# Patient Record
Sex: Female | Born: 1950 | Race: White | Hispanic: No | Marital: Married | Smoking: Never smoker
Health system: Southern US, Community
[De-identification: ages and names within clinical notes are randomized; demographics above are authoritative.]

## PROBLEM LIST (undated history)

## (undated) DIAGNOSIS — I341 Nonrheumatic mitral (valve) prolapse: Secondary | ICD-10-CM

## (undated) DIAGNOSIS — F419 Anxiety disorder, unspecified: Secondary | ICD-10-CM

## (undated) DIAGNOSIS — R61 Generalized hyperhidrosis: Secondary | ICD-10-CM

## (undated) DIAGNOSIS — M1990 Unspecified osteoarthritis, unspecified site: Secondary | ICD-10-CM

## (undated) DIAGNOSIS — I499 Cardiac arrhythmia, unspecified: Secondary | ICD-10-CM

## (undated) DIAGNOSIS — Z9889 Other specified postprocedural states: Secondary | ICD-10-CM

## (undated) DIAGNOSIS — Z923 Personal history of irradiation: Secondary | ICD-10-CM

## (undated) DIAGNOSIS — C50919 Malignant neoplasm of unspecified site of unspecified female breast: Secondary | ICD-10-CM

## (undated) DIAGNOSIS — Z9221 Personal history of antineoplastic chemotherapy: Secondary | ICD-10-CM

## (undated) DIAGNOSIS — R232 Flushing: Secondary | ICD-10-CM

## (undated) DIAGNOSIS — R112 Nausea with vomiting, unspecified: Secondary | ICD-10-CM

## (undated) HISTORY — DX: Flushing: R23.2

## (undated) HISTORY — DX: Generalized hyperhidrosis: R61

## (undated) HISTORY — DX: Nonrheumatic mitral (valve) prolapse: I34.1

## (undated) HISTORY — DX: Anxiety disorder, unspecified: F41.9

## (undated) HISTORY — PX: EXTERNAL EAR SURGERY: SHX627

## (undated) HISTORY — DX: Personal history of antineoplastic chemotherapy: Z92.21

## (undated) HISTORY — PX: DILATION AND CURETTAGE OF UTERUS: SHX78

## (undated) HISTORY — PX: OTHER SURGICAL HISTORY: SHX169

## (undated) HISTORY — DX: Malignant neoplasm of unspecified site of unspecified female breast: C50.919

---

## 2012-06-08 DIAGNOSIS — C50919 Malignant neoplasm of unspecified site of unspecified female breast: Secondary | ICD-10-CM | POA: Insufficient documentation

## 2012-06-08 HISTORY — DX: Malignant neoplasm of unspecified site of unspecified female breast: C50.919

## 2012-06-09 ENCOUNTER — Other Ambulatory Visit: Payer: Self-pay | Admitting: Radiology

## 2012-06-09 DIAGNOSIS — C50911 Malignant neoplasm of unspecified site of right female breast: Secondary | ICD-10-CM

## 2012-06-12 ENCOUNTER — Telehealth: Payer: Self-pay | Admitting: *Deleted

## 2012-06-12 ENCOUNTER — Ambulatory Visit
Admission: RE | Admit: 2012-06-12 | Discharge: 2012-06-12 | Disposition: A | Discharge status: Home / Self Care | Payer: BC Managed Care – PPO | Source: Ambulatory Visit | Attending: Radiology | Admitting: Radiology

## 2012-06-12 ENCOUNTER — Other Ambulatory Visit: Payer: Self-pay | Admitting: *Deleted

## 2012-06-12 DIAGNOSIS — C50412 Malignant neoplasm of upper-outer quadrant of left female breast: Secondary | ICD-10-CM | POA: Insufficient documentation

## 2012-06-12 DIAGNOSIS — C50911 Malignant neoplasm of unspecified site of right female breast: Secondary | ICD-10-CM

## 2012-06-12 DIAGNOSIS — C50419 Malignant neoplasm of upper-outer quadrant of unspecified female breast: Secondary | ICD-10-CM

## 2012-06-12 MED ORDER — GADOBENATE DIMEGLUMINE 529 MG/ML IV SOLN
11.0000 mL | Freq: Once | INTRAVENOUS | Status: AC | PRN
  Administered 2012-06-12: 11 mL via INTRAVENOUS

## 2012-06-12 NOTE — Telephone Encounter (Signed)
error 

## 2012-06-12 NOTE — Telephone Encounter (Signed)
Confirmed [facility] for 06/17/12 at 0800 .  Instructions and contact information given.

## 2012-06-17 ENCOUNTER — Encounter: Payer: Self-pay | Admitting: *Deleted

## 2012-06-17 ENCOUNTER — Ambulatory Visit (HOSPITAL_BASED_OUTPATIENT_CLINIC_OR_DEPARTMENT_OTHER): Payer: BC Managed Care – PPO | Admitting: General Surgery

## 2012-06-17 ENCOUNTER — Ambulatory Visit: Payer: BC Managed Care – PPO | Attending: General Surgery | Admitting: Physical Therapy

## 2012-06-17 ENCOUNTER — Other Ambulatory Visit (HOSPITAL_BASED_OUTPATIENT_CLINIC_OR_DEPARTMENT_OTHER): Payer: BC Managed Care – PPO | Admitting: Lab

## 2012-06-17 ENCOUNTER — Telehealth: Payer: Self-pay | Admitting: *Deleted

## 2012-06-17 ENCOUNTER — Encounter: Payer: Self-pay | Admitting: Oncology

## 2012-06-17 ENCOUNTER — Ambulatory Visit (HOSPITAL_BASED_OUTPATIENT_CLINIC_OR_DEPARTMENT_OTHER): Payer: BC Managed Care – PPO | Admitting: Oncology

## 2012-06-17 ENCOUNTER — Other Ambulatory Visit (INDEPENDENT_AMBULATORY_CARE_PROVIDER_SITE_OTHER): Payer: Self-pay | Admitting: General Surgery

## 2012-06-17 ENCOUNTER — Ambulatory Visit: Payer: BC Managed Care – PPO

## 2012-06-17 ENCOUNTER — Ambulatory Visit
Admission: RE | Admit: 2012-06-17 | Discharge: 2012-06-17 | Disposition: A | Discharge status: Home / Self Care | Payer: BC Managed Care – PPO | Source: Ambulatory Visit | Attending: Radiation Oncology | Admitting: Radiation Oncology

## 2012-06-17 VITALS — BP 149/74 | HR 58 | Temp 97.9°F | Resp 20 | Ht 63.0 in | Wt 124.9 lb

## 2012-06-17 DIAGNOSIS — C50419 Malignant neoplasm of upper-outer quadrant of unspecified female breast: Secondary | ICD-10-CM

## 2012-06-17 DIAGNOSIS — I059 Rheumatic mitral valve disease, unspecified: Secondary | ICD-10-CM

## 2012-06-17 DIAGNOSIS — M24529 Contracture, unspecified elbow: Secondary | ICD-10-CM | POA: Insufficient documentation

## 2012-06-17 DIAGNOSIS — I471 Supraventricular tachycardia, unspecified: Secondary | ICD-10-CM

## 2012-06-17 DIAGNOSIS — IMO0001 Reserved for inherently not codable concepts without codable children: Secondary | ICD-10-CM | POA: Insufficient documentation

## 2012-06-17 DIAGNOSIS — Z170 Estrogen receptor positive status [ER+]: Secondary | ICD-10-CM

## 2012-06-17 DIAGNOSIS — C50919 Malignant neoplasm of unspecified site of unspecified female breast: Secondary | ICD-10-CM

## 2012-06-17 DIAGNOSIS — I341 Nonrheumatic mitral (valve) prolapse: Secondary | ICD-10-CM | POA: Insufficient documentation

## 2012-06-17 LAB — COMPREHENSIVE METABOLIC PANEL (CC13)
AST: 23 U/L (ref 5–34)
Albumin: 3.8 g/dL (ref 3.5–5.0)
BUN: 11 mg/dL (ref 7.0–26.0)
CO2: 25 mEq/L (ref 22–29)
Calcium: 9 mg/dL (ref 8.4–10.4)
Chloride: 107 mEq/L (ref 98–107)
Potassium: 3.6 mEq/L (ref 3.5–5.1)

## 2012-06-17 LAB — CBC WITH DIFFERENTIAL/PLATELET
BASO%: 0.6 % (ref 0.0–2.0)
EOS%: 1.5 % (ref 0.0–7.0)
LYMPH%: 25.1 % (ref 14.0–49.7)
MCHC: 34.4 g/dL (ref 31.5–36.0)
MCV: 93.2 fL (ref 79.5–101.0)
MONO#: 0.6 10*3/uL (ref 0.1–0.9)
MONO%: 12.1 % (ref 0.0–14.0)
Platelets: 219 10*3/uL (ref 145–400)
RBC: 4.3 10*6/uL (ref 3.70–5.45)
WBC: 5.3 10*3/uL (ref 3.9–10.3)

## 2012-06-17 NOTE — Progress Notes (Signed)
Patient ID: Cassie Carney, female   DOB: 03/26/1951, 61 y.o.   MRN: [id number]  No chief complaint on file.   HPI Cassie Carney is a 61 y.o. female.   HPI  She is referred by Dr. 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 She noticed a mass in her breast a number of weeks ago. She noticed a slight bit of skin dimpling in the area which was the upper outer quadrant. She subsequently underwent imaging studies. An ultrasound-guided biopsy of the mass was performed which demonstrated invasive lobular carcinoma. On ultrasound, the mass was 1.7 cm in size. Postbiopsy MRI demonstrated a 3.8 cm mass was close to the pectoralis muscle.  The tumor is estrogen receptor and progesterone receptor positive, her 2 negative, proliferation rate is 18%. Her images and pathology were discussed at the breast cancer multidisciplinary conference this morning.  There is no family history of breast cancer. Age at menarche was 13. Age at first live birth was 30. She is not used hormone replacement therapy.  Past Medical History  Diagnosis Date  . Breast cancer   . Mitral valve prolapse   . Night sweats   . Anxiety   . Hot flashes     Past Surgical History  Procedure Date  . Broken arm   . Broken wrist     No family history on file.  Social History History  Substance Use Topics  . Smoking status: Never Smoker   . Smokeless tobacco: Never Used  . Alcohol Use: 4.2 oz/week    7 Glasses of wine per week    No Known Allergies  Current Outpatient Prescriptions  Medication Sig Dispense Refill  . ALPRAZolam (XANAX) 0.5 MG tablet Take 0.5 mg by mouth 3 (three) times daily as needed.      . metoprolol succinate (TOPROL-XL) 25 MG 24 hr tablet Take 25 mg by mouth daily.        Review of Systems Review of Systems  Constitutional:       She has hot flashes  HENT: Negative.   Eyes: Negative.   Respiratory: Negative.   Cardiovascular:  Positive for palpitations.  Gastrointestinal: Positive for nausea.  Genitourinary: Negative.   Musculoskeletal: Negative.   Neurological: Negative.   Hematological: Negative.   Psychiatric/Behavioral: The patient is nervous/anxious.     There were no vitals taken for this visit.  Physical Exam Physical Exam  Constitutional: She appears well-developed and well-nourished. No distress.  HENT:  Head: Normocephalic and atraumatic.  Eyes: EOM are normal. No scleral icterus.  Neck: Neck supple.  Cardiovascular: Normal rate and regular rhythm.   Murmur heard. Pulmonary/Chest: Effort normal and breath sounds normal.       Left breast-no palpable masses or suspicious skin changes.   Right breast-palpable mass in upper outer quadrant to 3 cm in size with overlying ecchymosis and mild skin dimpling.  Musculoskeletal: She exhibits no edema.       No axillary or supra-auricular adenopathy.  Lymphadenopathy:    She has no cervical adenopathy.  Neurological: She is alert.  Skin: Skin is warm and dry.    Data Reviewed Mammograms, ultrasounds, MRI, pathology.  Assessment     Diagnosed invasive lobular cancer of the right breast extending down to the pectoralis major muscle area with slight skin dimpling.  She has T 2 lesions on exam and MRI.    Plan    Neoadjuvant therapy to shrink the tumor.   We'll do a right axillary sentinel lymph node biopsy prior to initiating treatment. She will also need a Port-A-Cath. The procedures and risks have been discussed with her. Risks include but are not limited to bleeding, infection, wound problems, anesthesia, nerve injury, seroma formation, pneumothorax, DVT, catheter malfunction.         Kaitlynn Tramontana J 06/17/2012, 9:48 AM    

## 2012-06-17 NOTE — Patient Instructions (Signed)
My office will call you to schedule your surgery. 

## 2012-06-17 NOTE — Telephone Encounter (Signed)
GAVE PATIENT ECHO [place] 10:00SM GAVE PATIENT APPOINTMENT CHEMO CLASS 4:00PM SENT MICHELLE EMAIL TO SET UP TREATMENT 07-01-2012 FOR LAB MD TREATMENT

## 2012-06-17 NOTE — Progress Notes (Signed)
Radiation Oncology         [phone]) [phone] ________________________________  Initial outpatient Consultation  Name: Cassie Carney MRN: [id number]  Date: 06/17/2012  DOB: 09-Dec-1950  REFERRING PHYSICIAN: Rogelia Mire  DIAGNOSIS: T2N0 Invasive Lobular Carcinoma of the right breast  HISTORY OF PRESENT ILLNESS::Cassie Carney is a 61 y.o. female  who palpated a right breast mass. Mammogram was performed which showed a 1.7 cm mass in the right breast. A biopsy revealed a grade 1-2 invasive lobular carcinoma which was ER/PR positive HER-2 negative and Ki-67 was 18%. An MRI of the bilateral breasts was performed at which showed this mass with possible skin enhancement and close proximity but not involvement of the pectoralis muscle. The mass measured 1.8 x 1.9 x 3.8 cm in total. Cassie Carney states of this mass was not painful. She denies any nipple discharge but had noticed some skin dimpling.. She denies any previous history of breast cancer. She is accompanied by her husband today.   PREVIOUS RADIATION THERAPY: No  PAST MEDICAL HISTORY:  has a past medical history of Breast cancer; Mitral valve prolapse; Night sweats; Anxiety; and Hot flashes.    PAST SURGICAL HISTORY: Past Surgical History  Procedure Date  . Broken arm   . Broken wrist     FAMILY HISTORY: family history is not on file.  SOCIAL HISTORY:  reports that she has never smoked. She has never used smokeless tobacco. She reports that she drinks about 4.2 ounces of alcohol per week. She reports that she does not use illicit drugs.  ALLERGIES: Review of patient's allergies indicates no known allergies.  MEDICATIONS:  Current Outpatient Prescriptions  Medication Sig Dispense Refill  . ALPRAZolam (XANAX) 0.5 MG tablet Take 0.5 mg by mouth 3 (three) times daily as needed.      . metoprolol succinate (TOPROL-XL) 25 MG 24 hr tablet Take 25 mg by mouth daily.        REVIEW OF SYSTEMS:  A 15 point review of systems is documented in the  electronic medical record. This was obtained by the nursing staff. However, I reviewed this with the patient to discuss relevant findings and make appropriate changes.  Pertinent items are noted in HPI.    LABORATORY DATA:  Lab Results  Component Value Date   WBC 5.3 06/17/2012   HGB 13.8 06/17/2012   HCT 40.1 06/17/2012   MCV 93.2 06/17/2012   PLT 219 06/17/2012   Lab Results  Component Value Date   NA 141 06/17/2012   K 3.6 06/17/2012   CL 107 06/17/2012   CO2 25 06/17/2012   Lab Results  Component Value Date   ALT 33 06/17/2012   AST 23 06/17/2012   ALKPHOS 100 06/17/2012   BILITOT 0.50 06/17/2012     RADIOGRAPHY: Mr Breast Bilateral W Wo Contrast  06/15/2012  *RADIOLOGY REPORT*  Clinical Data: Recently diagnosed right breast invasive ductal carcinoma.  Preoperative evaluation.  BUN and creatinine were obtained on site at [facility] at [address] Results:  BUN 12 mg/dL,  Creatinine 0.9 mg/dL.  BILATERAL BREAST MRI WITH AND WITHOUT CONTRAST  Technique: Multiplanar, multisequence MR images of both breasts were obtained prior to and following the intravenous administration of 11ml of Multihance.  Three dimensional images were evaluated at the independent DynaCad workstation.  Comparison:  Previous mammograms from [facility] dated 06/09/2012, 06/04/2012, 01/09/2012, 06/05/2010.  Findings: There is an irregular enhancing mass located within the posterior third of the right breast within the  upper-outer quadrant.  This is associated with retraction of the skin and slight overlying skin enhancement.  There is a clip artifact associated the lateral aspect of this mass.  The mass measures 1.8 x 1.9 x 3.8 cm in size.  A portion of this enhancement likely reflects post biopsy change.  The mass does border the pectoralis muscle along its posterior aspect.  There is no evidence for muscle involvement.  There are no additional worrisome enhancing foci within either breast and there is no  axillary or internal mammary adenopathy.  There are no additional findings.  IMPRESSION: Irregular enhancing mass measuring 3.8 x 1.9 x 1.8 cm in size. Some of this enhancement likely reflects post biopsy change.  There is no evidence for adenopathy and there are no additional findings.  RECOMMENDATION: Treatment plan  THREE-DIMENSIONAL MR IMAGE RENDERING ON INDEPENDENT WORKSTATION:  Three-dimensional MR images were rendered by post-processing of the original MR data on an independent workstation.  The three- dimensional MR images were interpreted, and findings were reported in the accompanying complete MRI report for this study.  BI-RADS CATEGORY 6:  Known biopsy-proven malignancy - appropriate action should be taken.   Original Report Authenticated By: Rolla Plate, M.D.       IMPRESSION: T2 N0 invasive lobular carcinoma of the right breast  PLAN: I spoke with Cassie Carney and her husband today. Due to the extent of her disease as well as her small breast size mastectomy has been recommended. Neoadjuvant chemotherapy has been recommended to ensure good surgical margins. She will has been scheduled for Port-A-Cath with Dr. Abbey Chatters next week. We decided at the time of that surgery to perform a sentinel lymph node biopsy is at this point she has no indications for postmastectomy radiation. If the sentinel lymph node biopsy is positive she would require postmastectomy radiation. If that sentinel lymph node biopsy is negative she would not require postmastectomy radiation and could proceed on with immediate reconstruction. We discussed briefly the process of simulation the placement tattoos. We discussed 6 weeks of treatment as an outpatient. We discussed skin redness and fatigue as possible side effects. We discussed the radiation would likely start about 3 months after chemotherapy is completed, and one month after her surgery. Hopefully she will not require radiation but I did tell her if she wanted to meet  with me after surgery and after chemotherapy just to ensure that her radiation recommendations were that she remembered that I would be happy to do so. She has met with surgery medical oncology as well as the patient navigator and a member of our patient and family support staff. She is also been evaluated by physical therapy. I spent 30 minutes  face to face with the patient and more than 50% of that time was spent in counseling and/or coordination of care.   ------------------------------------------------  Lurline Hare, MD

## 2012-06-17 NOTE — Progress Notes (Signed)
[facility] Psychosocial Distress Screening Clinical Social Work  Patient completed distress screening protocol, and scored a 7 on the Psychosocial Distress Thermometer which indicates moderate distress. Clinical Social Worker met with patient and patient's husband in [facility] to assess for distress and other psychosocial needs.  Pt stated she felt less stress after meeting with the physicians, and knowing her treatment plan.  CSW informed pt of the support team and support services at [facility], and provided pt with a patient and family support calendar and additional resources information.  CSW encouraged pt to call with any additional questions or concerns.    Tamala Julian, MSW, LCSW Clinical Social Worker [facility] [phone]

## 2012-06-17 NOTE — Progress Notes (Signed)
Cassie Carney [id number] January 02, 1951 61 y.o. 06/17/2012 11:46 AM  CC  [name], MD [address] [place] [address] Dr. Lurline Hare Dr. Avel Peace  REASON FOR CONSULTATION:  61 year old female with new diagnosis of invasive lobular carcinoma of the right breast.Patient was seen in the Multidisciplinary Breast Clinic for discussion of her treatment options.  STAGE:   Cancer of upper-outer quadrant of female breast   Primary site: Breast (Right)   Staging method: AJCC 7th Edition   Clinical: Stage IIA (T2, N0, cM0)   Summary: Stage IIA (T2, N0, cM0)  REFERRING PHYSICIAN: Dr. Avel Peace  HISTORY OF PRESENT ILLNESS:  Cassie Carney is a 61 y.o. female.  With medical history significant for mitral valve prolapse anxiety and hot flashes. Patient recently noted a slight bit of skin dimpling in the upper outer quadrant of the right breast. She went on to have mammogram performed that showed the breast to be heterogeneously dense spiculated mass measuring 9 mm was noted in the upper outer quadrant of the right breast no associated microcalcifications although scattered microcalcifications were present these were stable. Patient went on to have a right breast ultrasound that showed a hypoechoic mass with irregular margins noted in the 10:00 position 5 cm from the nipple this correlated with the mammographic abnormality. Lesion measured 1.7 x 1.2 cm in greatest dimension. Patient went on to have needle core biopsy performed that showed an invasive lobular carcinoma that was estrogen receptor +100% progesterone receptor +99% proliferation marker Ki-67 18% HER-2/neu was negative with a ratio of 0.71. Patient went on to have MRI of the breasts performed on 06/12/2012. The MRI showed irregular enhancing mass located within the posterior third of the right breast within the upper outer quadrant this was associated with retraction of the skin and slight overlying skin  enhancement. The mass measured 1.8 x 1.9 x 3.8 cm in size. The mass did border the pectoralis muscle along its posterior aspect there was no evidence of muscle involvement no additional worrisome enhancing foci within either breast were noted and there was no axillary or internal mammary adenopathy noted.  Patient's case was discussed in the multidisciplinary breast conference this morning. Treatment following NCCN guidelines were discussed for this patient but because patient may desire breast conservation neoadjuvant approach was also discussed at the conference. The neoadjuvant approach discussed was antiestrogen is up front versus neoadjuvant chemotherapy.  Patient is without any complaints.   Past Medical History: Past Medical History  Diagnosis Date  . Breast cancer   . Mitral valve prolapse   . Night sweats   . Anxiety   . Hot flashes     Past Surgical History: Past Surgical History  Procedure Date  . Broken arm   . Broken wrist     Family History: History reviewed. No pertinent family history.  Social History History  Substance Use Topics  . Smoking status: Never Smoker   . Smokeless tobacco: Never Used  . Alcohol Use: 4.2 oz/week    7 Glasses of wine per week    Allergies: No Known Allergies  Current Medications: Current Outpatient Prescriptions  Medication Sig Dispense Refill  . ALPRAZolam (XANAX) 0.5 MG tablet Take 0.5 mg by mouth 3 (three) times daily as needed.      . metoprolol succinate (TOPROL-XL) 25 MG 24 hr tablet Take 25 mg by mouth daily.        OB/GYN History:Menarche at age 76 patient is postmenopausal she has not  been on hormone replacement therapy. First live birth at 79 she is G1.  Fertility Discussion:Patient has completed her family Prior History of Cancer: No prior history of malignancies  Health Maintenance:  Colonoscopy Patient had a colonoscopy in 2009 Bone Density Bone density was performed in 2009 Last PAP smear Last Pap smear in  2009  ECOG PERFORMANCE STATUS: 0 - Asymptomatic  Genetic Counseling/testing: Patient's father had squamous cell carcinoma at the age of 19. No other family history of ovarian breast endometrial or colon malignancies. Therefore genetic counseling and testing is not recommended at this time.  REVIEW OF SYSTEMS: Patient denies fatigue, headache, visual changes, confusion, drenching night sweats, palpable lymph node swelling, mucositis, odynophagia, dysphagia, nausea vomiting, jaundice, chest pain, palpitation, shortness of breath, dyspnea on exertion, productive cough, gum bleeding, epistaxis, hematemesis, hemoptysis, abdominal pain, abdominal swelling, early satiety, melena, hematochezia, hematuria, skin rash, spontaneous bleeding, joint swelling, joint pain, heat or cold intolerance, bowel bladder incontinence, back pain, focal motor weakness, paresthesia, depression, suicidal or homocidal ideation, feeling hopelessness.  PHYSICAL EXAMINATION: Blood pressure 149/74, pulse 58, temperature 97.9 F (36.6 C), resp. rate 20, height 5\' 3"  (1.6 m), weight 124 lb 14.4 oz (56.654 kg).  Gen.: Well-developed well-nourished female in no acute distress.  HEENT exam EOMI PERRLA sclerae anicteric no conjunctival pallor oral mucosa is moist neck is supple no palpable cervical subclavicular or axillary adenopathy lungs are clear to auscultation and percussion cardiovascular is regular rate rhythm no murmurs gallops or rubs abdomen is soft nontender nondistended bowel sounds are present no hepatosplenomegaly extremities no clubbing edema or cyanosis neuro patient's alert oriented features of +4 motor and sensory is intact strength is symmetrical in upper and lower extremities Breast examination: Left breast no masses or nipple discharge right breast does reveal a palpable mass measuring approximately 2 cm slightly tender to touch with area of ecchymosis there is no skin changes noted no nipple  retraction  STUDIES/RESULTS: Mr Breast Bilateral W Wo Contrast  06/15/2012  *RADIOLOGY REPORT*  Clinical Data: Recently diagnosed right breast invasive ductal carcinoma.  Preoperative evaluation.  BUN and creatinine were obtained on site at [facility] at [address] Results:  BUN 12 mg/dL,  Creatinine 0.9 mg/dL.  BILATERAL BREAST MRI WITH AND WITHOUT CONTRAST  Technique: Multiplanar, multisequence MR images of both breasts were obtained prior to and following the intravenous administration of 11ml of Multihance.  Three dimensional images were evaluated at the independent DynaCad workstation.  Comparison:  Previous mammograms from [facility] dated 06/09/2012, 06/04/2012, 01/09/2012, 06/05/2010.  Findings: There is an irregular enhancing mass located within the posterior third of the right breast within the upper-outer quadrant.  This is associated with retraction of the skin and slight overlying skin enhancement.  There is a clip artifact associated the lateral aspect of this mass.  The mass measures 1.8 x 1.9 x 3.8 cm in size.  A portion of this enhancement likely reflects post biopsy change.  The mass does border the pectoralis muscle along its posterior aspect.  There is no evidence for muscle involvement.  There are no additional worrisome enhancing foci within either breast and there is no axillary or internal mammary adenopathy.  There are no additional findings.  IMPRESSION: Irregular enhancing mass measuring 3.8 x 1.9 x 1.8 cm in size. Some of this enhancement likely reflects post biopsy change.  There is no evidence for adenopathy and there are no additional findings.  RECOMMENDATION: Treatment plan  THREE-DIMENSIONAL MR IMAGE RENDERING ON INDEPENDENT WORKSTATION:  Three-dimensional MR images were rendered by post-processing of the original MR data on an independent workstation.  The three- dimensional MR images were interpreted, and findings were reported in the accompanying complete  MRI report for this study.  BI-RADS CATEGORY 6:  Known biopsy-proven malignancy - appropriate action should be taken.   Original Report Authenticated By: Rolla Plate, M.D.      LABS:    Chemistry      Component Value Date/Time   NA 141 06/17/2012 0823   K 3.6 06/17/2012 0823   CL 107 06/17/2012 0823   CO2 25 06/17/2012 0823   BUN 11.0 06/17/2012 0823   CREATININE 0.8 06/17/2012 0823      Component Value Date/Time   CALCIUM 9.0 06/17/2012 0823   ALKPHOS 100 06/17/2012 0823   AST 23 06/17/2012 0823   ALT 33 06/17/2012 0823   BILITOT 0.50 06/17/2012 0823      Lab Results  Component Value Date   WBC 5.3 06/17/2012   HGB 13.8 06/17/2012   HCT 40.1 06/17/2012   MCV 93.2 06/17/2012   PLT 219 06/17/2012       PATHOLOGY: Invasive lobular carcinoma, ER+ 100%, PR + 99%, Ki-67 18%, Her2Neu negative  ASSESSMENT    61 year old female With  #1 new diagnosis of 3.5 cm invasive lobular carcinoma of the right breast extending down to the pectoralis major muscle area with slight skin dimpling. Patient is clinical stage II. There is no evidence of lymphadenopathy by MRI. Patient desires breast conservation. Therefore we discussed neoadjuvant therapy to reduce the size of the tumor for better breast conservation. We also discussed right axillary sentinel lymph node biopsy prior to initiating treatment for better assessment of the axilla.  #2 patient and I discussed neoadjuvant approach to try to shrink the tumor. The 2 potential approaches in this individual could be either doing neoadjuvant antiestrogen therapy for 8-12 months time versus doing neoadjuvant chemotherapy. I do think that we may get a faster response with neoadjuvant chemotherapy. My recommended by recommendation was to proceed with neoadjuvant chemotherapy consisting of Taxotere and Cytoxan for a total of 4 cycles. The cycles will be given every 21 days with day 2 Neulasta. Once patient completed 4 cycles then we would plan on doing MRI  of the breasts for evaluation of response. Patient understands that she will need a Port-A-Cath placement for chemotherapy administration.  #3 due to patient's history of mitral valve prolapse I would also recommend we do an echocardiogram.     PLAN:    Patient will proceed initially with right axillary sentinel lymph node biopsy and Port-A-Cath placement.  #2 I will set her up for an echocardiogram as well as chemotherapy teaching class. My plan would would be to get her started on chemotherapy in about 2-3 weeks' time. We discussed the rationale for chemotherapy side effects of chemotherapy as well as the benefits. Patient and her family concurred with the treatment plan and we will proceed forward. Patient was given literature on both Taxotere Cytoxan as well as Neulasta.  #3 patient will return back in about 2-3 weeks' time for initial chemotherapy.      Thank you so much for allowing me to participate in the care of Cassie Carney. I will continue to follow up the patient with you and assist in her care.  All questions were answered. The patient knows to call the clinic with any problems, questions or concerns. We can certainly see the patient much sooner if necessary.  I spent 60 minutes counseling the patient face to face. The total time spent in the appointment was 60 minutes. Drue Second, MD Medical/Oncology [facility] [phone] (beeper) [phone] (Office)  06/17/2012, 11:46 AM

## 2012-06-17 NOTE — Patient Instructions (Addendum)
port placement Chemo class Echocardiogram  I will see you back in 2 weeks with chemotherapy class

## 2012-06-18 ENCOUNTER — Encounter: Payer: Self-pay | Admitting: *Deleted

## 2012-06-18 NOTE — Progress Notes (Signed)
Mailed after appt letter to pt. 

## 2012-06-23 ENCOUNTER — Other Ambulatory Visit: Payer: BC Managed Care – PPO

## 2012-06-23 ENCOUNTER — Encounter: Payer: Self-pay | Admitting: *Deleted

## 2012-06-23 ENCOUNTER — Telehealth: Payer: Self-pay | Admitting: *Deleted

## 2012-06-23 NOTE — Telephone Encounter (Signed)
Left vm for pt to return call regarding [facility] from 06/17/12. 

## 2012-06-24 ENCOUNTER — Ambulatory Visit (HOSPITAL_COMMUNITY)
Admission: RE | Admit: 2012-06-24 | Discharge: 2012-06-24 | Disposition: A | Discharge status: Home / Self Care | Payer: BC Managed Care – PPO | Source: Ambulatory Visit | Attending: Oncology | Admitting: Oncology

## 2012-06-24 ENCOUNTER — Other Ambulatory Visit: Payer: Self-pay | Admitting: *Deleted

## 2012-06-24 ENCOUNTER — Encounter: Payer: Self-pay | Admitting: Oncology

## 2012-06-24 DIAGNOSIS — I517 Cardiomegaly: Secondary | ICD-10-CM

## 2012-06-24 DIAGNOSIS — C50419 Malignant neoplasm of upper-outer quadrant of unspecified female breast: Secondary | ICD-10-CM

## 2012-06-24 DIAGNOSIS — Z01818 Encounter for other preprocedural examination: Secondary | ICD-10-CM | POA: Insufficient documentation

## 2012-06-24 DIAGNOSIS — I059 Rheumatic mitral valve disease, unspecified: Secondary | ICD-10-CM | POA: Insufficient documentation

## 2012-06-24 NOTE — Progress Notes (Signed)
Echocardiogram 2D Echocardiogram has been performed.  Mliss Wedin 06/24/2012, 11:13 AM

## 2012-06-24 NOTE — Progress Notes (Signed)
[employer] [id number] opt 2/5/4/5/1, per automated system starting 07/05/12 echos will require precert.

## 2012-06-25 ENCOUNTER — Encounter (HOSPITAL_BASED_OUTPATIENT_CLINIC_OR_DEPARTMENT_OTHER): Payer: Self-pay | Admitting: *Deleted

## 2012-06-25 NOTE — Progress Notes (Signed)
Did call [facility] to see if a NUC inj was needed-did have pt come in 1 1/2 hr preop just in case

## 2012-06-25 NOTE — Progress Notes (Signed)
To come in for labs-saw a cardiologist last fall for palpitations-had echo then and stress test-ekg-called for notes-had echo for chemo 06/24/12

## 2012-06-26 ENCOUNTER — Encounter (HOSPITAL_BASED_OUTPATIENT_CLINIC_OR_DEPARTMENT_OTHER)
Admission: RE | Admit: 2012-06-26 | Discharge: 2012-06-26 | Disposition: A | Discharge status: Home / Self Care | Payer: BC Managed Care – PPO | Source: Ambulatory Visit | Attending: General Surgery | Admitting: General Surgery

## 2012-06-26 ENCOUNTER — Other Ambulatory Visit: Payer: Self-pay

## 2012-06-26 LAB — CBC WITH DIFFERENTIAL/PLATELET
Basophils Relative: 0 % (ref 0–1)
Eosinophils Absolute: 0.1 10*3/uL (ref 0.0–0.7)
Eosinophils Relative: 1 % (ref 0–5)
MCH: 31 pg (ref 26.0–34.0)
MCHC: 33.5 g/dL (ref 30.0–36.0)
MCV: 92.6 fL (ref 78.0–100.0)
Monocytes Relative: 11 % (ref 3–12)
Neutrophils Relative %: 60 % (ref 43–77)
Platelets: 246 10*3/uL (ref 150–400)

## 2012-06-26 LAB — COMPREHENSIVE METABOLIC PANEL
Albumin: 3.8 g/dL (ref 3.5–5.2)
Alkaline Phosphatase: 99 U/L (ref 39–117)
BUN: 12 mg/dL (ref 6–23)
Calcium: 10.2 mg/dL (ref 8.4–10.5)
GFR calc Af Amer: >90 mL/min (ref >90)
Potassium: 4.3 mEq/L (ref 3.5–5.1)
Sodium: 141 mEq/L (ref 135–145)
Total Protein: 7 g/dL (ref 6.0–8.3)

## 2012-06-26 LAB — PROTIME-INR
INR: 0.91 (ref 0.00–1.49)
Prothrombin Time: 12.5 seconds (ref 11.6–15.2)

## 2012-06-29 ENCOUNTER — Ambulatory Visit (HOSPITAL_COMMUNITY)
Admission: RE | Admit: 2012-06-29 | Discharge: 2012-06-29 | Disposition: A | Discharge status: Home / Self Care | Payer: BC Managed Care – PPO | Source: Ambulatory Visit | Attending: General Surgery | Admitting: General Surgery

## 2012-06-29 ENCOUNTER — Ambulatory Visit (HOSPITAL_BASED_OUTPATIENT_CLINIC_OR_DEPARTMENT_OTHER): Payer: BC Managed Care – PPO | Admitting: Certified Registered Nurse Anesthetist

## 2012-06-29 ENCOUNTER — Ambulatory Visit (HOSPITAL_BASED_OUTPATIENT_CLINIC_OR_DEPARTMENT_OTHER)
Admission: RE | Admit: 2012-06-29 | Discharge: 2012-06-29 | Disposition: A | Discharge status: Home / Self Care | Payer: BC Managed Care – PPO | Source: Ambulatory Visit | Attending: General Surgery | Admitting: General Surgery

## 2012-06-29 ENCOUNTER — Ambulatory Visit (HOSPITAL_COMMUNITY): Payer: BC Managed Care – PPO

## 2012-06-29 ENCOUNTER — Encounter (HOSPITAL_BASED_OUTPATIENT_CLINIC_OR_DEPARTMENT_OTHER): Payer: Self-pay

## 2012-06-29 ENCOUNTER — Encounter (HOSPITAL_BASED_OUTPATIENT_CLINIC_OR_DEPARTMENT_OTHER)
Admission: RE | Disposition: A | Discharge status: Home / Self Care | Payer: Self-pay | Source: Ambulatory Visit | Attending: General Surgery

## 2012-06-29 ENCOUNTER — Encounter (HOSPITAL_BASED_OUTPATIENT_CLINIC_OR_DEPARTMENT_OTHER): Payer: Self-pay | Admitting: Certified Registered Nurse Anesthetist

## 2012-06-29 DIAGNOSIS — C50419 Malignant neoplasm of upper-outer quadrant of unspecified female breast: Secondary | ICD-10-CM | POA: Insufficient documentation

## 2012-06-29 DIAGNOSIS — Z01810 Encounter for preprocedural cardiovascular examination: Secondary | ICD-10-CM | POA: Insufficient documentation

## 2012-06-29 DIAGNOSIS — C50919 Malignant neoplasm of unspecified site of unspecified female breast: Secondary | ICD-10-CM

## 2012-06-29 DIAGNOSIS — I059 Rheumatic mitral valve disease, unspecified: Secondary | ICD-10-CM | POA: Insufficient documentation

## 2012-06-29 DIAGNOSIS — I498 Other specified cardiac arrhythmias: Secondary | ICD-10-CM | POA: Insufficient documentation

## 2012-06-29 DIAGNOSIS — Z01812 Encounter for preprocedural laboratory examination: Secondary | ICD-10-CM | POA: Insufficient documentation

## 2012-06-29 DIAGNOSIS — C773 Secondary and unspecified malignant neoplasm of axilla and upper limb lymph nodes: Secondary | ICD-10-CM

## 2012-06-29 DIAGNOSIS — F411 Generalized anxiety disorder: Secondary | ICD-10-CM | POA: Insufficient documentation

## 2012-06-29 HISTORY — PX: PORTACATH PLACEMENT: SHX2246

## 2012-06-29 HISTORY — DX: Unspecified osteoarthritis, unspecified site: M19.90

## 2012-06-29 HISTORY — DX: Nausea with vomiting, unspecified: Z98.890

## 2012-06-29 HISTORY — DX: Nausea with vomiting, unspecified: R11.2

## 2012-06-29 SURGERY — BIOPSY, LYMPH NODE, SENTINEL, AXILLARY
Anesthesia: General | Site: Axilla | Laterality: Right | Wound class: Clean

## 2012-06-29 MED ORDER — LIDOCAINE HCL (CARDIAC) 20 MG/ML IV SOLN
INTRAVENOUS | Status: DC | PRN
  Administered 2012-06-29: 60 mg via INTRAVENOUS

## 2012-06-29 MED ORDER — METOCLOPRAMIDE HCL 5 MG/ML IJ SOLN: 10.0000 mg | Freq: Once | INTRAMUSCULAR | Status: DC | PRN

## 2012-06-29 MED ORDER — OXYCODONE HCL 5 MG PO TABS: 5.0000 mg | ORAL_TABLET | ORAL | Status: DC | PRN

## 2012-06-29 MED ORDER — TECHNETIUM TC 99M SULFUR COLLOID FILTERED
1.0000 | Freq: Once | INTRAVENOUS | Status: AC | PRN
  Administered 2012-06-29: 1 via INTRADERMAL

## 2012-06-29 MED ORDER — MIDAZOLAM HCL 5 MG/5ML IJ SOLN
INTRAMUSCULAR | Status: DC | PRN
  Administered 2012-06-29: 1 mg via INTRAVENOUS

## 2012-06-29 MED ORDER — MIDAZOLAM HCL 2 MG/2ML IJ SOLN
1.0000 mg | INTRAMUSCULAR | Status: DC | PRN
  Administered 2012-06-29: 1 mg via INTRAVENOUS

## 2012-06-29 MED ORDER — OXYCODONE HCL 5 MG PO TABS
5.0000 mg | ORAL_TABLET | Freq: Once | ORAL | Status: AC | PRN
  Administered 2012-06-29: 5 mg via ORAL

## 2012-06-29 MED ORDER — CEFAZOLIN SODIUM-DEXTROSE 2-3 GM-% IV SOLR
2.0000 g | INTRAVENOUS | Status: AC
  Administered 2012-06-29: 2 g via INTRAVENOUS

## 2012-06-29 MED ORDER — MORPHINE SULFATE 2 MG/ML IJ SOLN: 2.0000 mg | INTRAMUSCULAR | Status: DC | PRN

## 2012-06-29 MED ORDER — LACTATED RINGERS IV SOLN
INTRAVENOUS | Status: DC
  Administered 2012-06-29 (×2): via INTRAVENOUS

## 2012-06-29 MED ORDER — SODIUM CHLORIDE 0.9 % IJ SOLN: 3.0000 mL | INTRAMUSCULAR | Status: DC | PRN

## 2012-06-29 MED ORDER — HEPARIN (PORCINE) IN NACL 2-0.9 UNIT/ML-% IJ SOLN
INTRAMUSCULAR | Status: DC | PRN
  Administered 2012-06-29: 1

## 2012-06-29 MED ORDER — FENTANYL CITRATE 0.05 MG/ML IJ SOLN
INTRAMUSCULAR | Status: DC | PRN
  Administered 2012-06-29 (×2): 50 ug via INTRAVENOUS
  Administered 2012-06-29: 25 ug via INTRAVENOUS

## 2012-06-29 MED ORDER — ACETAMINOPHEN 650 MG RE SUPP: 650.0000 mg | RECTAL | Status: DC | PRN

## 2012-06-29 MED ORDER — FENTANYL CITRATE 0.05 MG/ML IJ SOLN
50.0000 ug | INTRAMUSCULAR | Status: DC | PRN
  Administered 2012-06-29: 50 ug via INTRAVENOUS

## 2012-06-29 MED ORDER — EPHEDRINE SULFATE 50 MG/ML IJ SOLN
INTRAMUSCULAR | Status: DC | PRN
  Administered 2012-06-29 (×2): 10 mg via INTRAVENOUS

## 2012-06-29 MED ORDER — HYDROCODONE-ACETAMINOPHEN 5-325 MG PO TABS: 1.0000 | ORAL_TABLET | ORAL | Status: AC | PRN

## 2012-06-29 MED ORDER — HYDROMORPHONE HCL PF 1 MG/ML IJ SOLN: 0.2500 mg | INTRAMUSCULAR | Status: DC | PRN

## 2012-06-29 MED ORDER — DEXAMETHASONE SODIUM PHOSPHATE 4 MG/ML IJ SOLN
INTRAMUSCULAR | Status: DC | PRN
  Administered 2012-06-29: 10 mg via INTRAVENOUS

## 2012-06-29 MED ORDER — HEPARIN SOD (PORK) LOCK FLUSH 100 UNIT/ML IV SOLN
INTRAVENOUS | Status: DC | PRN
  Administered 2012-06-29: 2.5 [IU]

## 2012-06-29 MED ORDER — ONDANSETRON HCL 4 MG/2ML IJ SOLN: 4.0000 mg | Freq: Four times a day (QID) | INTRAMUSCULAR | Status: DC | PRN

## 2012-06-29 MED ORDER — BUPIVACAINE HCL (PF) 0.25 % IJ SOLN
INTRAMUSCULAR | Status: DC | PRN
  Administered 2012-06-29: 13 mL

## 2012-06-29 MED ORDER — OXYCODONE HCL 5 MG/5ML PO SOLN: 5.0000 mg | Freq: Once | ORAL | Status: AC | PRN

## 2012-06-29 MED ORDER — ACETAMINOPHEN 10 MG/ML IV SOLN
1000.0000 mg | Freq: Once | INTRAVENOUS | Status: AC
  Administered 2012-06-29: 1000 mg via INTRAVENOUS

## 2012-06-29 MED ORDER — ONDANSETRON HCL 4 MG/2ML IJ SOLN
INTRAMUSCULAR | Status: DC | PRN
  Administered 2012-06-29: 4 mg via INTRAVENOUS

## 2012-06-29 MED ORDER — PROPOFOL 10 MG/ML IV BOLUS
INTRAVENOUS | Status: DC | PRN
  Administered 2012-06-29: 200 mg via INTRAVENOUS

## 2012-06-29 MED ORDER — SCOPOLAMINE 1 MG/3DAYS TD PT72
1.0000 | MEDICATED_PATCH | Freq: Once | TRANSDERMAL | Status: DC
  Administered 2012-06-29: 1.5 mg via TRANSDERMAL

## 2012-06-29 MED ORDER — ACETAMINOPHEN 325 MG PO TABS: 650.0000 mg | ORAL_TABLET | ORAL | Status: DC | PRN

## 2012-06-29 SURGICAL SUPPLY — 58 items
BAG DECANTER FOR FLEXI CONT (MISCELLANEOUS) ×3 IMPLANT
BENZOIN TINCTURE PRP APPL 2/3 (GAUZE/BANDAGES/DRESSINGS) ×6 IMPLANT
BLADE HEX COATED 2.75 (ELECTRODE) ×3 IMPLANT
BLADE SURG 11 STRL SS (BLADE) ×3 IMPLANT
BLADE SURG 15 STRL LF DISP TIS (BLADE) ×4 IMPLANT
BLADE SURG 15 STRL SS (BLADE) ×2
BLADE SURG ROTATE 9660 (MISCELLANEOUS) IMPLANT
CANISTER SUCTION 1200CC (MISCELLANEOUS) IMPLANT
CHLORAPREP W/TINT 26ML (MISCELLANEOUS) ×3 IMPLANT
CLEANER CAUTERY TIP 5X5 PAD (MISCELLANEOUS) ×2 IMPLANT
CLOTH BEACON ORANGE TIMEOUT ST (SAFETY) ×3 IMPLANT
COVER MAYO STAND STRL (DRAPES) ×3 IMPLANT
COVER PROBE 5X48 (MISCELLANEOUS) ×1
COVER TABLE BACK 60X90 (DRAPES) ×3 IMPLANT
DECANTER SPIKE VIAL GLASS SM (MISCELLANEOUS) ×6 IMPLANT
DRAPE C-ARM 42X72 X-RAY (DRAPES) ×3 IMPLANT
DRAPE LAPAROTOMY TRNSV 102X78 (DRAPE) ×3 IMPLANT
DRAPE UTILITY XL STRL (DRAPES) ×3 IMPLANT
DRSG TEGADERM 2-3/8X2-3/4 SM (GAUZE/BANDAGES/DRESSINGS) IMPLANT
DRSG TEGADERM 4X4.75 (GAUZE/BANDAGES/DRESSINGS) ×3 IMPLANT
ELECT REM PT RETURN 9FT ADLT (ELECTROSURGICAL) ×3
ELECTRODE REM PT RTRN 9FT ADLT (ELECTROSURGICAL) ×2 IMPLANT
GAUZE SPONGE 4X4 12PLY STRL LF (GAUZE/BANDAGES/DRESSINGS) ×6 IMPLANT
GAUZE SPONGE 4X4 16PLY XRAY LF (GAUZE/BANDAGES/DRESSINGS) IMPLANT
GLOVE BIOGEL PI IND STRL 8.5 (GLOVE) ×2 IMPLANT
GLOVE BIOGEL PI INDICATOR 8.5 (GLOVE) ×1
GLOVE ECLIPSE 8.0 STRL XLNG CF (GLOVE) ×3 IMPLANT
GOWN PREVENTION PLUS XLARGE (GOWN DISPOSABLE) ×9 IMPLANT
GOWN PREVENTION PLUS XXLARGE (GOWN DISPOSABLE) ×3 IMPLANT
IV CATH PLACEMENT UNIT 16 GA (IV SOLUTION) IMPLANT
IV HEPARIN 1000UNITS/500ML (IV SOLUTION) ×3 IMPLANT
IV KIT MINILOC 20X1 SAFETY (NEEDLE) IMPLANT
KIT CVR 48X5XPRB PLUP LF (MISCELLANEOUS) ×2 IMPLANT
KIT POWER CATH 8FR (Catheter) ×3 IMPLANT
NDL SAFETY ECLIPSE 18X1.5 (NEEDLE) ×2 IMPLANT
NEEDLE HYPO 18GX1.5 BLUNT FILL (NEEDLE) ×3 IMPLANT
NEEDLE HYPO 18GX1.5 SHARP (NEEDLE) ×1
NEEDLE HYPO 22GX1.5 SAFETY (NEEDLE) ×9 IMPLANT
NEEDLE HYPO 25X1 1.5 SAFETY (NEEDLE) ×3 IMPLANT
NS IRRIG 1000ML POUR BTL (IV SOLUTION) ×3 IMPLANT
PACK BASIN DAY SURGERY FS (CUSTOM PROCEDURE TRAY) ×3 IMPLANT
PAD CLEANER CAUTERY TIP 5X5 (MISCELLANEOUS) ×1
PENCIL BUTTON HOLSTER BLD 10FT (ELECTRODE) ×3 IMPLANT
SET SHEATH INTRODUCER 10FR (MISCELLANEOUS) IMPLANT
SLEEVE SCD COMPRESS KNEE MED (MISCELLANEOUS) IMPLANT
SPONGE GAUZE 2X2 8PLY STRL LF (GAUZE/BANDAGES/DRESSINGS) ×6 IMPLANT
STRIP CLOSURE SKIN 1/2X4 (GAUZE/BANDAGES/DRESSINGS) ×3 IMPLANT
SUT MNCRL AB 4-0 PS2 18 (SUTURE) ×6 IMPLANT
SUT VIC AB 2-0 SH 18 (SUTURE) ×6 IMPLANT
SUT VIC AB 3-0 SH 27 (SUTURE) ×1
SUT VIC AB 3-0 SH 27X BRD (SUTURE) ×2 IMPLANT
SYR 5ML LUER SLIP (SYRINGE) ×3 IMPLANT
SYR CONTROL 10ML LL (SYRINGE) ×3 IMPLANT
TOWEL OR 17X24 6PK STRL BLUE (TOWEL DISPOSABLE) ×6 IMPLANT
TOWEL OR NON WOVEN STRL DISP B (DISPOSABLE) ×3 IMPLANT
TUBE CONNECTING 20X1/4 (TUBING) IMPLANT
WATER STERILE IRR 1000ML POUR (IV SOLUTION) IMPLANT
YANKAUER SUCT BULB TIP NO VENT (SUCTIONS) IMPLANT

## 2012-06-29 NOTE — Interval H&P Note (Signed)
History and Physical Interval Note:  06/29/2012 7:26 AM  Cassie Carney  has presented today for surgery, with the diagnosis of right breast cancer  The various methods of treatment have been discussed with the patient and family. After consideration of risks, benefits and other options for treatment, the patient has consented to  Procedure(s) (LRB) with comments: AXILLARY SENTINEL NODE BIOPSY (Right) - right axillary sentinel lymph node biopsy, ultrasound guided Port-A-Cath insertion INSERTION PORT-A-CATH (N/A) -  ultrasound guided Port-A-Cath insertion as a surgical intervention .  The patient's history has been reviewed, patient examined, no change in status, stable for surgery.  I have reviewed the patient's chart and labs.  Questions were answered to the patient's satisfaction.     [name] Shela Commons

## 2012-06-29 NOTE — Anesthesia Postprocedure Evaluation (Signed)
Anesthesia Post Note  Patient: Cassie Carney  Procedure(s) Performed: Procedure(s) (LRB): AXILLARY SENTINEL NODE BIOPSY (Right) INSERTION PORT-A-CATH (N/A)  Anesthesia type: General  Patient location: PACU  Post pain: Pain level controlled  Post assessment: Patient's Cardiovascular Status Stable  Last Vitals:  Filed Vitals:   06/29/12 1000  BP: 121/61  Pulse: 75  Temp:   Resp: 16    Post vital signs: Reviewed and stable  Level of consciousness: alert  Complications: No apparent anesthesia complications

## 2012-06-29 NOTE — Transfer of Care (Signed)
Immediate Anesthesia Transfer of Care Note  Patient: Cassie Carney  Procedure(s) Performed: Procedure(s) (LRB) with comments: AXILLARY SENTINEL NODE BIOPSY (Right) - right axillary sentinel lymph node biopsy, ultrasound guided Port-A-Cath insertion INSERTION PORT-A-CATH (N/A) -  ultrasound guided Port-A-Cath insertion  Patient Location: PACU  Anesthesia Type: General  Level of Consciousness: awake, alert , oriented and patient cooperative  Airway & Oxygen Therapy: Patient Spontanous Breathing and Patient connected to face mask oxygen  Post-op Assessment: Report given to PACU RN and Post -op Vital signs reviewed and stable  Post vital signs: Reviewed and stable  Complications: No apparent anesthesia complications

## 2012-06-29 NOTE — Op Note (Signed)
Operative Note  Cassie Carney female 61 y.o. 06/29/2012  PREOPERATIVE DX:  Right breast cancer ([id number])  POSTOPERATIVE DX:  Same  PROCEDURE:  1.  Right axillary lymphatic mapping and sentinel lymph node biopsy.  2.  Ultrasound-guided Port-A-Cath insertion into right internal jugular vein with fluoroscopy.         Surgeon: Adolph Pollack   Assistants: Alvera Singh, PA-S  Anesthesia: General LMA anesthesia  Indications:   This is a 61 year old female with a T2 palpable breast mass is abutting the chest wall. It has been recommended that she undergo neoadjuvant chemotherapy and pretreatment sentinel lymph node biopsy and now she presents for that. The procedures, risks, and aftercare been discussed with her preoperatively.    Procedure Detail:  In the holding area she was seen in the right breast to mark with my initials. She also had injection of the radioactive substance into the right breast. She's and brought to the operating room, placed supine on the operating table, and a general anesthetic was given. Using the neoprobe, lymphatic mapping was performed at the right axilla. An area of increased counts was identified in the right axilla and marked with a marking pen. Following this, the breasts, chest wall, right axilla, and neck were all sterilely prepped and draped.  I began with the sentinel lymph node biopsy. A transverse incision was made in the inferior right axilla and the subcutaneous tissue was divided with cautery. The axillary lymphatic tissue was identified. Using the neoprobe an area of increased counts was identified and removed with electrocautery. This was labeled sentinel lymph node #1 and sent to pathology for permanent section. A second area of increased counts was noted in the right axilla and tissue was removed and this was labeled as sentinel lymph node #2 and sent to pathology for permanent section. No other areas of increased counts were noted. Marcaine was  injected for local anesthetic affect. Hemostasis was adequate at this time. The subcutaneous tissue was closed with interrupted 3-0 Vicryl sutures. The skin was closed with a running 4-0 Monocryl subcuticular stitch. Steri-Strips were applied.  Next, I identified the right internal jugular vein using the ultrasound. Under ultrasound guidance the right internal jugular vein was cannulated with a 16-gauge needle and the wire was passed into the right heart with its position verified by way of fluoroscopy. The incision around the wire was widened a little bit. Local anesthetic was then infiltrated in the superior right chest wall and a small incision made sharply. Using electrocautery a pocket was created for the port on the muscular fascia. The catheter  was passed from the chest incision up to the neck incision. A dilator introducer complex was placed over the wire into the superior vena cava. The wire and dilator were removed. The catheter was then threaded through the peel-away sheath introducer. The peel-away sheath introducer was removed.  Using fluoroscopy, the catheter tip was pulled back until it was in the distal superior vena cava. The catheter was cut at the chest wall incision site and then threaded onto the port. The port aspirated blood and flushed easily. The port was anchored to the chest wall skin with interrupted 2-0 Vicryl sutures. The port incision site was closed in 2 layers. The subcutaneous tissue was approximated with running 3-0 Vicryl suture. The skin was closed with a running 4-0 Monocryl subcuticular stitch. The skin incision on the neck was closed with a 4 Monocryl subcuticular stitch. Steri-Strips were applied. A Huber needle was then used  to cannulate the port and concentrated heparin solution placed into the port. The needle was left in. Sterile dressings were then applied to the chest wound, neck wound, and axillary wound.  She tolerated the procedures well without any apparent  complications and was taken to the recovery room in satisfactory condition.   Estimated Blood Loss:  less than 100 mL         Drains: none  Blood Given: none          Specimens: Right axillary sentinel lymph nodes        Complications:  * No complications entered in OR log *         Disposition: PACU - hemodynamically stable.         Condition: stable

## 2012-06-29 NOTE — Anesthesia Preprocedure Evaluation (Signed)
Anesthesia Evaluation  Patient identified by MRN, date of birth, ID band Patient awake    Reviewed: Allergy & Precautions, H&P , NPO status , Patient's Chart, lab work & pertinent test results, reviewed documented beta blocker date and time   History of Anesthesia Complications (+) PONV  Airway Mallampati: II TM Distance: >3 FB Neck ROM: full    Dental   Pulmonary neg pulmonary ROS,  breath sounds clear to auscultation        Cardiovascular + dysrhythmias Supra Ventricular Tachycardia + Valvular Problems/Murmurs MVP Rhythm:regular     Neuro/Psych PSYCHIATRIC DISORDERS Anxiety negative neurological ROS     GI/Hepatic negative GI ROS, Neg liver ROS,   Endo/Other  negative endocrine ROS  Renal/GU negative Renal ROS  negative genitourinary   Musculoskeletal   Abdominal   Peds  Hematology negative hematology ROS (+)   Anesthesia Other Findings See surgeon's H&P   Reproductive/Obstetrics negative OB ROS                           Anesthesia Physical Anesthesia Plan  ASA: II  Anesthesia Plan: General   Post-op Pain Management:    Induction: Intravenous  Airway Management Planned: LMA  Additional Equipment:   Intra-op Plan:   Post-operative Plan: Extubation in OR  Informed Consent: I have reviewed the patients History and Physical, chart, labs and discussed the procedure including the risks, benefits and alternatives for the proposed anesthesia with the patient or authorized representative who has indicated his/her understanding and acceptance.   Dental Advisory Given  Plan Discussed with: CRNA and Surgeon  Anesthesia Plan Comments:         Anesthesia Quick Evaluation

## 2012-06-29 NOTE — H&P (View-Only) (Signed)
Patient ID: Cassie Carney, female   DOB: 07-24-1951, 61 y.o.   MRN: [id number]  No chief complaint on file.   HPI Cassie Carney is a 61 y.o. female.   HPI  She is referred by Dr. 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 She noticed a mass in her breast a number of weeks ago. She noticed a slight bit of skin dimpling in the area which was the upper outer quadrant. She subsequently underwent imaging studies. An ultrasound-guided biopsy of the mass was performed which demonstrated invasive lobular carcinoma. On ultrasound, the mass was 1.7 cm in size. Postbiopsy MRI demonstrated a 3.8 cm mass was close to the pectoralis muscle.  The tumor is estrogen receptor and progesterone receptor positive, her 2 negative, proliferation rate is 18%. Her images and pathology were discussed at the breast cancer multidisciplinary conference this morning.  There is no family history of breast cancer. Age at menarche was 40. Age at first live birth was 21. She is not used hormone replacement therapy.  Past Medical History  Diagnosis Date  . Breast cancer   . Mitral valve prolapse   . Night sweats   . Anxiety   . Hot flashes     Past Surgical History  Procedure Date  . Broken arm   . Broken wrist     No family history on file.  Social History History  Substance Use Topics  . Smoking status: Never Smoker   . Smokeless tobacco: Never Used  . Alcohol Use: 4.2 oz/week    7 Glasses of wine per week    No Known Allergies  Current Outpatient Prescriptions  Medication Sig Dispense Refill  . ALPRAZolam (XANAX) 0.5 MG tablet Take 0.5 mg by mouth 3 (three) times daily as needed.      . metoprolol succinate (TOPROL-XL) 25 MG 24 hr tablet Take 25 mg by mouth daily.        Review of Systems Review of Systems  Constitutional:       She has hot flashes  HENT: Negative.   Eyes: Negative.   Respiratory: Negative.   Cardiovascular:  Positive for palpitations.  Gastrointestinal: Positive for nausea.  Genitourinary: Negative.   Musculoskeletal: Negative.   Neurological: Negative.   Hematological: Negative.   Psychiatric/Behavioral: The patient is nervous/anxious.     There were no vitals taken for this visit.  Physical Exam Physical Exam  Constitutional: She appears well-developed and well-nourished. No distress.  HENT:  Head: Normocephalic and atraumatic.  Eyes: EOM are normal. No scleral icterus.  Neck: Neck supple.  Cardiovascular: Normal rate and regular rhythm.   Murmur heard. Pulmonary/Chest: Effort normal and breath sounds normal.       Left breast-no palpable masses or suspicious skin changes.   Right breast-palpable mass in upper outer quadrant to 3 cm in size with overlying ecchymosis and mild skin dimpling.  Musculoskeletal: She exhibits no edema.       No axillary or supra-auricular adenopathy.  Lymphadenopathy:    She has no cervical adenopathy.  Neurological: She is alert.  Skin: Skin is warm and dry.    Data Reviewed Mammograms, ultrasounds, MRI, pathology.  Assessment     Diagnosed invasive lobular cancer of the right breast extending down to the pectoralis major muscle area with slight skin dimpling.  She has T 2 lesions on exam and MRI.    Plan    Neoadjuvant therapy to shrink the tumor.  We'll do a right axillary sentinel lymph node biopsy prior to initiating treatment. She will also need a Port-A-Cath. The procedures and risks have been discussed with her. Risks include but are not limited to bleeding, infection, wound problems, anesthesia, nerve injury, seroma formation, pneumothorax, DVT, catheter malfunction.         Cassie Carney J 06/17/2012, 9:48 AM

## 2012-06-30 ENCOUNTER — Encounter (HOSPITAL_BASED_OUTPATIENT_CLINIC_OR_DEPARTMENT_OTHER): Payer: Self-pay | Admitting: General Surgery

## 2012-07-01 ENCOUNTER — Telehealth: Payer: Self-pay | Admitting: Oncology

## 2012-07-01 ENCOUNTER — Telehealth: Payer: Self-pay | Admitting: *Deleted

## 2012-07-01 ENCOUNTER — Ambulatory Visit (HOSPITAL_BASED_OUTPATIENT_CLINIC_OR_DEPARTMENT_OTHER): Payer: BC Managed Care – PPO

## 2012-07-01 ENCOUNTER — Encounter: Payer: Self-pay | Admitting: Oncology

## 2012-07-01 ENCOUNTER — Ambulatory Visit (HOSPITAL_BASED_OUTPATIENT_CLINIC_OR_DEPARTMENT_OTHER): Payer: BC Managed Care – PPO | Admitting: Oncology

## 2012-07-01 ENCOUNTER — Other Ambulatory Visit (HOSPITAL_BASED_OUTPATIENT_CLINIC_OR_DEPARTMENT_OTHER): Payer: BC Managed Care – PPO | Admitting: Lab

## 2012-07-01 VITALS — BP 105/67 | HR 62 | Temp 98.1°F | Resp 20 | Ht 63.0 in | Wt 129.2 lb

## 2012-07-01 VITALS — BP 122/72 | HR 50 | Temp 98.9°F | Resp 18

## 2012-07-01 DIAGNOSIS — C50419 Malignant neoplasm of upper-outer quadrant of unspecified female breast: Secondary | ICD-10-CM

## 2012-07-01 DIAGNOSIS — Z170 Estrogen receptor positive status [ER+]: Secondary | ICD-10-CM

## 2012-07-01 DIAGNOSIS — Z5111 Encounter for antineoplastic chemotherapy: Secondary | ICD-10-CM

## 2012-07-01 LAB — CBC WITH DIFFERENTIAL/PLATELET
BASO%: 0.3 % (ref 0.0–2.0)
Basophils Absolute: 0 10*3/uL (ref 0.0–0.1)
EOS%: 1.3 % (ref 0.0–7.0)
HCT: 38.5 % (ref 34.8–46.6)
HGB: 12.4 g/dL (ref 11.6–15.9)
MCH: 30.3 pg (ref 25.1–34.0)
MONO#: 0.9 10*3/uL (ref 0.1–0.9)
NEUT#: 3.9 10*3/uL (ref 1.5–6.5)
RDW: 13.2 % (ref 11.2–14.5)
WBC: 7.1 10*3/uL (ref 3.9–10.3)
lymph#: 2.2 10*3/uL (ref 0.9–3.3)

## 2012-07-01 LAB — COMPREHENSIVE METABOLIC PANEL (CC13)
ALT: 58 U/L — ABNORMAL HIGH (ref 0–55)
AST: 36 U/L — ABNORMAL HIGH (ref 5–34)
Albumin: 3.6 g/dL (ref 3.5–5.0)
BUN: 12 mg/dL (ref 7.0–26.0)
CO2: 27 mEq/L (ref 22–29)
Calcium: 9.4 mg/dL (ref 8.4–10.4)
Chloride: 107 mEq/L (ref 98–107)
Potassium: 3.7 mEq/L (ref 3.5–5.1)

## 2012-07-01 MED ORDER — DEXAMETHASONE 4 MG PO TABS: ORAL_TABLET | ORAL | Status: DC

## 2012-07-01 MED ORDER — SODIUM CHLORIDE 0.9 % IJ SOLN
10.0000 mL | INTRAMUSCULAR | Status: DC | PRN
  Administered 2012-07-01: 10 mL
  Filled 2012-07-01: qty 10

## 2012-07-01 MED ORDER — ONDANSETRON HCL 8 MG PO TABS: ORAL_TABLET | ORAL | Status: DC

## 2012-07-01 MED ORDER — SODIUM CHLORIDE 0.9 % IV SOLN
600.0000 mg/m2 | Freq: Once | INTRAVENOUS | Status: AC
  Administered 2012-07-01: 940 mg via INTRAVENOUS
  Filled 2012-07-01: qty 47

## 2012-07-01 MED ORDER — DEXAMETHASONE SODIUM PHOSPHATE 4 MG/ML IJ SOLN
20.0000 mg | Freq: Once | INTRAMUSCULAR | Status: AC
  Administered 2012-07-01: 20 mg via INTRAVENOUS

## 2012-07-01 MED ORDER — ONDANSETRON 16 MG/50ML IVPB (CHCC)
16.0000 mg | Freq: Once | INTRAVENOUS | Status: AC
  Administered 2012-07-01: 16 mg via INTRAVENOUS

## 2012-07-01 MED ORDER — SODIUM CHLORIDE 0.9 % IV SOLN
Freq: Once | INTRAVENOUS | Status: AC
  Administered 2012-07-01: 11:00:00 via INTRAVENOUS

## 2012-07-01 MED ORDER — LIDOCAINE-PRILOCAINE 2.5-2.5 % EX CREA: TOPICAL_CREAM | CUTANEOUS | Status: DC | PRN

## 2012-07-01 MED ORDER — DOCETAXEL CHEMO INJECTION 160 MG/16ML
75.0000 mg/m2 | Freq: Once | INTRAVENOUS | Status: AC
  Administered 2012-07-01: 120 mg via INTRAVENOUS
  Filled 2012-07-01: qty 12

## 2012-07-01 MED ORDER — HEPARIN SOD (PORK) LOCK FLUSH 100 UNIT/ML IV SOLN
500.0000 [IU] | Freq: Once | INTRAVENOUS | Status: AC | PRN
  Administered 2012-07-01: 500 [IU]
  Filled 2012-07-01: qty 5

## 2012-07-01 MED ORDER — PROCHLORPERAZINE 25 MG RE SUPP: 25.0000 mg | Freq: Two times a day (BID) | RECTAL | Status: DC | PRN

## 2012-07-01 MED ORDER — LORAZEPAM 0.5 MG PO TABS: 0.5000 mg | ORAL_TABLET | Freq: Four times a day (QID) | ORAL | Status: DC | PRN

## 2012-07-01 MED ORDER — PROCHLORPERAZINE MALEATE 10 MG PO TABS: 10.0000 mg | ORAL_TABLET | Freq: Four times a day (QID) | ORAL | Status: DC | PRN

## 2012-07-01 NOTE — Patient Instructions (Addendum)
Proceed with chemotherapy today  You will return tomorrow for neulasta injection. One hour prior to the injection please take claritin and tyelnol (650 mg) to help prevent aches and pains  Take you anti-nausea medications as precribed below. Take the dexamethasone 8 mg twice a day today and tomorrow and then stop. Prior to cycle 2 of chemotherapy I would like you begin the dexamethasone 8 mg twice a day the day before, day of and day after the treatments.  I will see you back in 1 week for lab work and my visit to make sure you are doing well.  Please call with any problems or questions at [phone] or [phone]   PREMEDICATIONS INSTRUCTION SHEET  Medications   Decadron (dexamethasone): take 2 tablets twice a day starting day before, day of and day after the chemotherapy, the stop until next chemotherapy  o This is used to prevent nausea following chemo. It can make you have trouble sleeping at night. Please call the office if you are having problems with sleeping, facial flushing, or mood swings.   Compazine (prochlorperazine):  take 1 tablet every 6 hours as needed for nausea o This is used to prevent or treat nausea following chemo. Can take in addition to other prescribed nausea medications for "breakthrough" nausea following chemo.   Zofran (ondanstetron) : take 1 tablet every 12 hours starting 12 hours after chemo as needed  o This is also used to prevent or treat nausea following chemo but should not be used until 72 hours after treatment  IF YOU HAVE NAUSA OR VOMITING DESPITE FOLLOWING THESE INSTRUCTIONS, CALL OUR OFFICE AT [phone]!    Emla Cream: apply a cotton ball size to port 45 minutes before treatment and cover with plastic wrap. o This is used to numb the port prior using it for treatment. It is important that you do not rub it in or leave it open to air. Place the cream on top of the port then immediately cover with plastic wrap.   Tylenol or Aleve/Claritin: Take prior to  receiving the Neulasta shot (if scheduled) and continue for 2 -3 days for pain associated with the injection.

## 2012-07-01 NOTE — Patient Instructions (Addendum)
[facility] Discharge Instructions for Patients Receiving Chemotherapy  Today you received the following chemotherapy agents taxotere/cytoxan  To help prevent nausea and vomiting after your treatment, we encourage you to take your nausea medication as directed  If you develop nausea and vomiting that is not controlled by your nausea medication, call the clinic. If it is after clinic hours your family physician or the after hours number for the clinic or go to the Emergency Department.   BELOW ARE SYMPTOMS THAT SHOULD BE REPORTED IMMEDIATELY:  *FEVER GREATER THAN 100.5 F  *CHILLS WITH OR WITHOUT FEVER  NAUSEA AND VOMITING THAT IS NOT CONTROLLED WITH YOUR NAUSEA MEDICATION  *UNUSUAL SHORTNESS OF BREATH  *UNUSUAL BRUISING OR BLEEDING  TENDERNESS IN MOUTH AND THROAT WITH OR WITHOUT PRESENCE OF ULCERS  *URINARY PROBLEMS  *BOWEL PROBLEMS  UNUSUAL RASH Items with * indicate a potential emergency and should be followed up as soon as possible.  One of the nurses will contact you 24 hours after your treatment. Please let the nurse know about any problems that you may have experienced. Feel free to call the clinic you have any questions or concerns. The clinic phone number is [phone].   I have been informed and understand all the instructions given to me. I know to contact the clinic, my physician, or go to the Emergency Department if any problems should occur. I do not have any questions at this time, but understand that I may call the clinic during office hours   should I have any questions or need assistance in obtaining follow up care.    __________________________________________  _____________  __________ Signature of Patient or Authorized Representative            Date                   Time    __________________________________________ Nurse's Signature

## 2012-07-01 NOTE — Progress Notes (Signed)
OFFICE PROGRESS NOTE  CC  [name], MD [address] [place] [address] Dr. Avel Peace Dr. Lurline Hare   DIAGNOSIS: 61 year old female with invasive lobular carcinoma of the right breast.  PRIOR THERAPY:  #1 patient was originally seen in the multidisciplinary breast clinic on a 8/28/ 2013 for a stage II a invasive lobular carcinoma that was ER positive.  #2 at the time it was recommended patient proceed with neoadjuvant chemotherapy. But did want A. Upfront sentinel lymph node biopsy and a Port-A-Cath placed. She had this done 2 days ago.  #3 she is now being seen for start of cycle 1 day 1 of neoadjuvant Taxotere and Cytoxan. A total of 4 cycles is planned.  CURRENT THERAPY:Cycle 1 day 1 of neoadjuvant Taxotere and Cytoxan with day 2 Neulasta  INTERVAL HISTORY: Cassie Carney 61 y.o. female returns forFollowup visit prior to her chemotherapy today. Overall she is doing well. She does not know the results of her sentinel lymph node. She had her Port-A-Cath placed the port looks good. She denies any nausea vomiting fevers chills night sweats headaches no shortness of breath no chest pains no palpitations no fevers. She does have a little bit tenderness at the port site. Remainder of the 10 point review of systems is negative.  MEDICAL HISTORY: Past Medical History  Diagnosis Date  . Breast cancer   . Mitral valve prolapse   . Night sweats   . Anxiety   . Hot flashes   . PONV (postoperative nausea and vomiting)   . Arthritis     ALLERGIES:   has no known allergies.  MEDICATIONS:  Current Outpatient Prescriptions  Medication Sig Dispense Refill  . ALPRAZolam (XANAX) 0.5 MG tablet Take 0.5 mg by mouth 3 (three) times daily as needed.      Marland Kitchen dexamethasone (DECADRON) 4 MG tablet Take 2 tablets two times a day the day before Taxotere. Then take 2 tabs two times a day starting the day after chemo for 3 days.  30 tablet  1  . HYDROcodone-acetaminophen  (NORCO) 5-325 MG per tablet Take 1-2 tablets by mouth every 4 (four) hours as needed for pain.  30 tablet  1  . lidocaine-prilocaine (EMLA) cream Apply topically as needed.  30 g  5  . LORazepam (ATIVAN) 0.5 MG tablet Take 1 tablet (0.5 mg total) by mouth every 6 (six) hours as needed (Nausea or vomiting).  30 tablet  0  . metoprolol succinate (TOPROL-XL) 25 MG 24 hr tablet Take 25 mg by mouth at bedtime.       . ondansetron (ZOFRAN) 8 MG tablet Take 1 tablet two times a day starting the day after chemo for 3 days. Then take 1 tab two times a day as needed for nausea or vomiting.  30 tablet  1  . prochlorperazine (COMPAZINE) 10 MG tablet Take 1 tablet (10 mg total) by mouth every 6 (six) hours as needed (Nausea or vomiting).  30 tablet  1  . prochlorperazine (COMPAZINE) 25 MG suppository Place 1 suppository (25 mg total) rectally every 12 (twelve) hours as needed for nausea.  12 suppository  3    SURGICAL HISTORY:  Past Surgical History  Procedure Date  . Broken arm   . Broken wrist   . Dilation and curettage of uterus   . Portacath placement 06/29/2012    Procedure: INSERTION PORT-A-CATH;  Surgeon: Adolph Pollack, MD;  Location:  SURGERY CENTER;  Service: General;  Laterality:  N/A;   ultrasound guided Port-A-Cath insertion    REVIEW OF SYSTEMS:  Pertinent items are noted in HPI.   PHYSICAL EXAMINATION:  Well-developed well-nourished female in no acute distress a little bit anxious.  HEENT exam EOMI PERRLA sclerae anicteric no conjunctival pallor oral mucosa is moist neck is supple lungs are clear bilaterally to auscultation and percussion cardiovascular is regular rate rhythm no murmurs gallops or rubs abdomen is soft nontender nondistended bowel sounds are present no HSM extremities no clubbing edema or cyanosis neuro patient's alert oriented otherwise nonfocal. Skin no rashes.  Right breast palpable mass port site looks good  ECOG PERFORMANCE STATUS: 0 - Asymptomatic  Blood  pressure 105/67, pulse 62, temperature 98.1 F (36.7 C), temperature source Oral, resp. rate 20, height 5\' 3"  (1.6 m), weight 129 lb 3.2 oz (58.605 kg).  LABORATORY DATA: Lab Results  Component Value Date   WBC 7.1 07/01/2012   HGB 12.4 07/01/2012   HCT 38.5 07/01/2012   MCV 94.1 07/01/2012   PLT 198 07/01/2012      Chemistry      Component Value Date/Time   NA 141 06/26/2012 0830   NA 141 06/17/2012 0823   K 4.3 06/26/2012 0830   K 3.6 06/17/2012 0823   CL 105 06/26/2012 0830   CL 107 06/17/2012 0823   CO2 26 06/26/2012 0830   CO2 25 06/17/2012 0823   BUN 12 06/26/2012 0830   BUN 11.0 06/17/2012 0823   CREATININE 0.74 06/26/2012 0830   CREATININE 0.8 06/17/2012 0823      Component Value Date/Time   CALCIUM 10.2 06/26/2012 0830   CALCIUM 9.0 06/17/2012 0823   ALKPHOS 99 06/26/2012 0830   ALKPHOS 100 06/17/2012 0823   AST 30 06/26/2012 0830   AST 23 06/17/2012 0823   ALT 41* 06/26/2012 0830   ALT 33 06/17/2012 0823   BILITOT 0.4 06/26/2012 0830   BILITOT 0.50 06/17/2012 0272       RADIOGRAPHIC STUDIES:  Mr Breast Bilateral W Wo Contrast  06/15/2012  *RADIOLOGY REPORT*  Clinical Data: Recently diagnosed right breast invasive ductal carcinoma.  Preoperative evaluation.  BUN and creatinine were obtained on site at [facility] at [address] Results:  BUN 12 mg/dL,  Creatinine 0.9 mg/dL.  BILATERAL BREAST MRI WITH AND WITHOUT CONTRAST  Technique: Multiplanar, multisequence MR images of both breasts were obtained prior to and following the intravenous administration of 11ml of Multihance.  Three dimensional images were evaluated at the independent DynaCad workstation.  Comparison:  Previous mammograms from [facility] imaging dated 06/09/2012, 06/04/2012, 01/09/2012, 06/05/2010.  Findings: There is an irregular enhancing mass located within the posterior third of the right breast within the upper-outer quadrant.  This is associated with retraction of the skin and slight overlying skin enhancement.   There is a clip artifact associated the lateral aspect of this mass.  The mass measures 1.8 x 1.9 x 3.8 cm in size.  A portion of this enhancement likely reflects post biopsy change.  The mass does border the pectoralis muscle along its posterior aspect.  There is no evidence for muscle involvement.  There are no additional worrisome enhancing foci within either breast and there is no axillary or internal mammary adenopathy.  There are no additional findings.  IMPRESSION: Irregular enhancing mass measuring 3.8 x 1.9 x 1.8 cm in size. Some of this enhancement likely reflects post biopsy change.  There is no evidence for adenopathy and there are no additional findings.  RECOMMENDATION: Treatment  plan  THREE-DIMENSIONAL MR IMAGE RENDERING ON INDEPENDENT WORKSTATION:  Three-dimensional MR images were rendered by post-processing of the original MR data on an independent workstation.  The three- dimensional MR images were interpreted, and findings were reported in the accompanying complete MRI report for this study.  BI-RADS CATEGORY 6:  Known biopsy-proven malignancy - appropriate action should be taken.   Original Report Authenticated By: Rolla Plate, M.D.    Nm Sentinel Node Inj-no Rpt (breast)  06/29/2012  CLINICAL DATA: Right breast cancer   Sulfur colloid was injected intradermally by the nuclear medicine  technologist for breast cancer sentinel node localization.     Dg Chest Portable 1 View  06/29/2012  **ADDENDUM** CREATED: 06/29/2012 10:33:15  The patient did have a sonographic right internal jugular puncture and underwent axillary lymph node dissection.  The gas in the right axilla likely reflects operative changes from the dissection.  **END ADDENDUM** SIGNED BY: Marlowe Aschoff. Hoss, M.D.   06/29/2012  *RADIOLOGY REPORT*  Clinical Data: Port-A-Cath placement  PORTABLE CHEST - 1 VIEW  Comparison: None.  Findings: Right internal jugular vein Port-A-Cath has been placed. The tip is in the lower SVC.  No  pneumothorax.  Emphysema is present in the right axilla and over the right chest wall.  Mild cardiomegaly.  Vascular congestion.  No consolidation.  IMPRESSION: Right internal jugular vein Port-A-Cath placement with its tip in the lower SVC and no pneumothorax.  Subcutaneous emphysema over the right chest wall is noted.   Original Report Authenticated By: Donavan Burnet, M.D.    Dg Fluoro Guide Cv Line-no Report  06/29/2012  CLINICAL DATA: pportacath   FLOURO GUIDE CV LINE  Fluoroscopy was utilized by the requesting physician.  No radiographic  interpretation.      ASSESSMENT: 61 year old female with  #1Stage II a (T2 N0) invasive lobular carcinoma of the right breast. Patient is now here for neoadjuvant chemotherapy consisting of Taxotere and Cytoxan. She is here for cycle #1 with day 2 Neulasta. She had a Port-A-Cath placed. She has had her chemotherapy teaching class performed. She also has all of her antiemetics at her pharmacy and she will pick these up. Patient did undergo a sentinel lymph node biopsy results of which are pending at this time. Patient understands the risks and benefits of treatment and the rationale for neoadjuvant therapy. A total of 4 cycles of neoadjuvant therapy is planned.   PLAN:   #1 patient will proceed with her scheduled chemotherapy today. She will take her antiemetics as instructed.  #2 patient will return tomorrow for day 2 Neulasta.  #3 I will plan on seeing the patient back in one week's time for interim labs and office visit.   All questions were answered. The patient knows to call the clinic with any problems, questions or concerns. We can certainly see the patient much sooner if necessary.  I spent 25 minutes counseling the patient face to face. The total time spent in the appointment was 30 minutes.    Drue Second, MD Medical/Oncology [facility] [phone] (beeper) [phone] (Office)  07/01/2012, 9:07 AM

## 2012-07-01 NOTE — Telephone Encounter (Signed)
S/w michelle in the chemo room and she will gve the pt her schedules for spt and oct. Sent her a staff message

## 2012-07-01 NOTE — Telephone Encounter (Signed)
Pr staff message and POF I have scheduled appts. Gave pt calendar. JMW

## 2012-07-02 ENCOUNTER — Telehealth: Payer: Self-pay | Admitting: *Deleted

## 2012-07-02 ENCOUNTER — Telehealth (INDEPENDENT_AMBULATORY_CARE_PROVIDER_SITE_OTHER): Payer: Self-pay | Admitting: General Surgery

## 2012-07-02 ENCOUNTER — Ambulatory Visit (HOSPITAL_BASED_OUTPATIENT_CLINIC_OR_DEPARTMENT_OTHER): Payer: BC Managed Care – PPO

## 2012-07-02 ENCOUNTER — Encounter (INDEPENDENT_AMBULATORY_CARE_PROVIDER_SITE_OTHER): Payer: Self-pay | Admitting: General Surgery

## 2012-07-02 VITALS — BP 127/72 | HR 74 | Temp 97.3°F

## 2012-07-02 DIAGNOSIS — C50419 Malignant neoplasm of upper-outer quadrant of unspecified female breast: Secondary | ICD-10-CM

## 2012-07-02 DIAGNOSIS — C773 Secondary and unspecified malignant neoplasm of axilla and upper limb lymph nodes: Secondary | ICD-10-CM

## 2012-07-02 DIAGNOSIS — Z5189 Encounter for other specified aftercare: Secondary | ICD-10-CM

## 2012-07-02 MED ORDER — UNABLE TO FIND: Status: DC

## 2012-07-02 MED ORDER — PEGFILGRASTIM INJECTION 6 MG/0.6ML
6.0000 mg | Freq: Once | SUBCUTANEOUS | Status: AC
  Administered 2012-07-02: 6 mg via SUBCUTANEOUS
  Filled 2012-07-02: qty 0.6

## 2012-07-02 NOTE — Progress Notes (Signed)
Patient ID: Cassie Carney, female   DOB: [birth year], 61 y.o.   MRN: [id number] I spoke with her regarding her pathology results. 2 sentinel lymph nodes are involved with cancer. Dr. Welton Flakes discuss that with her yesterday. I told her that we would discuss definitive surgery when she completes her neoadjuvant treatment.

## 2012-07-02 NOTE — Telephone Encounter (Signed)
Patient returned my call.  Reports she is doing well.  Denies any side effects and denies questions.  Encouraged to call if any changes.

## 2012-07-02 NOTE — Telephone Encounter (Signed)
Message copied by Augusto Garbe on Thu Jul 02, 2012 11:16 AM ------      Message from: GARNER, [facility] P      Created: Wed Jul 01, 2012  2:42 PM      Regarding: f/u call       Cytoxan/taxotere       dr. Welton Flakes

## 2012-07-02 NOTE — Telephone Encounter (Signed)
Called patient for chemotherapy f/u.  Requested return call to Triage.  Awaiting return call from pt.

## 2012-07-02 NOTE — Telephone Encounter (Signed)
Pt called to clarify dressing orders.  She is POD #2 and per instructions, removed dressing to reveal steri-strips.  Pt advised to leave steri-strips intact;  OK to wash with soapy water and rinse, pat dry and leave open to air.  She understands and will comply.

## 2012-07-02 NOTE — Telephone Encounter (Signed)
Patient here for Neulasta injection following 1st cytoxan/taxotere.  States doing well   Only had a little nausea last night which was relieved with medication.  Drinking fluids.  No other complaints.  Knows to call if has any problems, concerns, or questions.

## 2012-07-08 ENCOUNTER — Other Ambulatory Visit (HOSPITAL_BASED_OUTPATIENT_CLINIC_OR_DEPARTMENT_OTHER): Payer: BC Managed Care – PPO | Admitting: Lab

## 2012-07-08 ENCOUNTER — Telehealth: Payer: Self-pay | Admitting: *Deleted

## 2012-07-08 ENCOUNTER — Ambulatory Visit (HOSPITAL_BASED_OUTPATIENT_CLINIC_OR_DEPARTMENT_OTHER): Payer: BC Managed Care – PPO | Admitting: Oncology

## 2012-07-08 VITALS — BP 107/71 | HR 69 | Temp 99.0°F | Resp 20 | Ht 63.0 in | Wt 125.2 lb

## 2012-07-08 DIAGNOSIS — C50419 Malignant neoplasm of upper-outer quadrant of unspecified female breast: Secondary | ICD-10-CM

## 2012-07-08 DIAGNOSIS — Z170 Estrogen receptor positive status [ER+]: Secondary | ICD-10-CM

## 2012-07-08 DIAGNOSIS — C50919 Malignant neoplasm of unspecified site of unspecified female breast: Secondary | ICD-10-CM

## 2012-07-08 LAB — CBC WITH DIFFERENTIAL/PLATELET
BASO%: 0.4 % (ref 0.0–2.0)
MCHC: 33.8 g/dL (ref 31.5–36.0)
MONO#: 1.6 10*3/uL — ABNORMAL HIGH (ref 0.1–0.9)
RBC: 4.23 10*6/uL (ref 3.70–5.45)
WBC: 12 10*3/uL — ABNORMAL HIGH (ref 3.9–10.3)
lymph#: 1.1 10*3/uL (ref 0.9–3.3)

## 2012-07-08 LAB — COMPREHENSIVE METABOLIC PANEL (CC13)
ALT: 251 U/L — ABNORMAL HIGH (ref 0–55)
AST: 84 U/L — ABNORMAL HIGH (ref 5–34)
CO2: 25 mEq/L (ref 22–29)
Calcium: 9.6 mg/dL (ref 8.4–10.4)
Chloride: 108 mEq/L — ABNORMAL HIGH (ref 98–107)
Sodium: 141 mEq/L (ref 136–145)
Total Bilirubin: 0.4 mg/dL (ref 0.20–1.20)
Total Protein: 6.3 g/dL — ABNORMAL LOW (ref 6.4–8.3)

## 2012-07-08 NOTE — Patient Instructions (Addendum)
Doing well  I will see you back in 2 weeks for next chemotherapy  PET scan for staging

## 2012-07-08 NOTE — Progress Notes (Signed)
OFFICE PROGRESS NOTE  CC  [name], MD [address] [place] [address] Dr. Avel Peace Dr. Lurline Hare   DIAGNOSIS: 61 year old female with invasive lobular carcinoma of the right breast.  PRIOR THERAPY:  #1 patient was originally seen in the multidisciplinary breast clinic on a 8/28/ 2013 for a stage II a invasive lobular carcinoma that was ER positive.  #2 at the time it was recommended patient proceed with neoadjuvant chemotherapy. But did want A. Upfront sentinel lymph node biopsy and a Port-A-Cath placed. She had this done 2 days ago.  #3 she is now being seen for start of cycle 1 day 1 of neoadjuvant Taxotere and Cytoxan. A total of 4 cycles is planned.With possible extension to 6 cycles do to being node positive  CURRENT THERAPY:s/p cycle 1 day 1 of neoadjuvant Taxotere and Cytoxan with day 2 Neulasta  INTERVAL HISTORY: Cassie Carney 61 y.o. female returns forFollowup visit prior to her chemotherapy today. Overall she is doing well. She does not know the results of her sentinel lymph node. She had her Port-A-Cath placed the port looks good. She denies any nausea vomiting fevers chills night sweats headaches no shortness of breath no chest pains no palpitations no fevers. She does have a little bit tenderness at the port site. Remainder of the 10 point review of systems is negative.  MEDICAL HISTORY: Past Medical History  Diagnosis Date  . Breast cancer   . Mitral valve prolapse   . Night sweats   . Anxiety   . Hot flashes   . PONV (postoperative nausea and vomiting)   . Arthritis     ALLERGIES:   has no known allergies.  MEDICATIONS:  Current Outpatient Prescriptions  Medication Sig Dispense Refill  . ALPRAZolam (XANAX) 0.5 MG tablet Take 0.5 mg by mouth 3 (three) times daily as needed.      Marland Kitchen dexamethasone (DECADRON) 4 MG tablet Take 2 tablets two times a day the day before Taxotere. Then take 2 tabs two times a day starting the day  after chemo for 3 days.  30 tablet  1  . HYDROcodone-acetaminophen (NORCO) 5-325 MG per tablet Take 1-2 tablets by mouth every 4 (four) hours as needed for pain.  30 tablet  1  . lidocaine-prilocaine (EMLA) cream Apply topically as needed.  30 g  5  . LORazepam (ATIVAN) 0.5 MG tablet Take 1 tablet (0.5 mg total) by mouth every 6 (six) hours as needed (Nausea or vomiting).  30 tablet  0  . metoprolol succinate (TOPROL-XL) 25 MG 24 hr tablet Take 25 mg by mouth at bedtime.       . ondansetron (ZOFRAN) 8 MG tablet Take 1 tablet two times a day starting the day after chemo for 3 days. Then take 1 tab two times a day as needed for nausea or vomiting.  30 tablet  1  . prochlorperazine (COMPAZINE) 10 MG tablet Take 1 tablet (10 mg total) by mouth every 6 (six) hours as needed (Nausea or vomiting).  30 tablet  1  . prochlorperazine (COMPAZINE) 25 MG suppository Place 1 suppository (25 mg total) rectally every 12 (twelve) hours as needed for nausea.  12 suppository  3  . UNABLE TO FIND Cranial Prothesis  1 each  0    SURGICAL HISTORY:  Past Surgical History  Procedure Date  . Broken arm   . Broken wrist   . Dilation and curettage of uterus   . Portacath placement 06/29/2012  Procedure: INSERTION PORT-A-CATH;  Surgeon: Adolph Pollack, MD;  Location: [facility];  Service: General;  Laterality: N/A;   ultrasound guided Port-A-Cath insertion    REVIEW OF SYSTEMS:  Pertinent items are noted in HPI.   PHYSICAL EXAMINATION:  Well-developed well-nourished female in no acute distress a little bit anxious.  HEENT exam EOMI PERRLA sclerae anicteric no conjunctival pallor oral mucosa is moist neck is supple lungs are clear bilaterally to auscultation and percussion cardiovascular is regular rate rhythm no murmurs gallops or rubs abdomen is soft nontender nondistended bowel sounds are present no HSM extremities no clubbing edema or cyanosis neuro patient's alert oriented otherwise nonfocal.  Skin no rashes.  Right breast palpable mass port site looks good  ECOG PERFORMANCE STATUS: 0 - Asymptomatic  Blood pressure 107/71, pulse 69, temperature 99 F (37.2 C), temperature source Oral, resp. rate 20, height 5\' 3"  (1.6 m), weight 125 lb 3.2 oz (56.79 kg).  LABORATORY DATA: Lab Results  Component Value Date   WBC 12.0* 07/08/2012   HGB 13.2 07/08/2012   HCT 39.2 07/08/2012   MCV 92.6 07/08/2012   PLT 165 07/08/2012      Chemistry      Component Value Date/Time   NA 143 07/01/2012 0836   NA 141 06/26/2012 0830   K 3.7 07/01/2012 0836   K 4.3 06/26/2012 0830   CL 107 07/01/2012 0836   CL 105 06/26/2012 0830   CO2 27 07/01/2012 0836   CO2 26 06/26/2012 0830   BUN 12.0 07/01/2012 0836   BUN 12 06/26/2012 0830   CREATININE 0.8 07/01/2012 0836   CREATININE 0.74 06/26/2012 0830      Component Value Date/Time   CALCIUM 9.4 07/01/2012 0836   CALCIUM 10.2 06/26/2012 0830   ALKPHOS 93 07/01/2012 0836   ALKPHOS 99 06/26/2012 0830   AST 36* 07/01/2012 0836   AST 30 06/26/2012 0830   ALT 58* 07/01/2012 0836   ALT 41* 06/26/2012 0830   BILITOT 0.40 07/01/2012 0836   BILITOT 0.4 06/26/2012 0830       RADIOGRAPHIC STUDIES:  Mr Breast Bilateral W Wo Contrast  06/15/2012  *RADIOLOGY REPORT*  Clinical Data: Recently diagnosed right breast invasive ductal carcinoma.  Preoperative evaluation.  BUN and creatinine were obtained on site at [facility] at [address] Results:  BUN 12 mg/dL,  Creatinine 0.9 mg/dL.  BILATERAL BREAST MRI WITH AND WITHOUT CONTRAST  Technique: Multiplanar, multisequence MR images of both breasts were obtained prior to and following the intravenous administration of 11ml of Multihance.  Three dimensional images were evaluated at the independent DynaCad workstation.  Comparison:  Previous mammograms from [facility] imaging dated 06/09/2012, 06/04/2012, 01/09/2012, 06/05/2010.  Findings: There is an irregular enhancing mass located within the posterior third of the right breast within  the upper-outer quadrant.  This is associated with retraction of the skin and slight overlying skin enhancement.  There is a clip artifact associated the lateral aspect of this mass.  The mass measures 1.8 x 1.9 x 3.8 cm in size.  A portion of this enhancement likely reflects post biopsy change.  The mass does border the pectoralis muscle along its posterior aspect.  There is no evidence for muscle involvement.  There are no additional worrisome enhancing foci within either breast and there is no axillary or internal mammary adenopathy.  There are no additional findings.  IMPRESSION: Irregular enhancing mass measuring 3.8 x 1.9 x 1.8 cm in size. Some of this enhancement likely  reflects post biopsy change.  There is no evidence for adenopathy and there are no additional findings.  RECOMMENDATION: Treatment plan  THREE-DIMENSIONAL MR IMAGE RENDERING ON INDEPENDENT WORKSTATION:  Three-dimensional MR images were rendered by post-processing of the original MR data on an independent workstation.  The three- dimensional MR images were interpreted, and findings were reported in the accompanying complete MRI report for this study.  BI-RADS CATEGORY 6:  Known biopsy-proven malignancy - appropriate action should be taken.   Original Report Authenticated By: Rolla Plate, M.D.    Nm Sentinel Node Inj-no Rpt (breast)  06/29/2012  CLINICAL DATA: Right breast cancer   Sulfur colloid was injected intradermally by the nuclear medicine  technologist for breast cancer sentinel node localization.     Dg Chest Portable 1 View  06/29/2012  **ADDENDUM** CREATED: 06/29/2012 10:33:15  The patient did have a sonographic right internal jugular puncture and underwent axillary lymph node dissection.  The gas in the right axilla likely reflects operative changes from the dissection.  **END ADDENDUM** SIGNED BY: Marlowe Aschoff. Hoss, M.D.   06/29/2012  *RADIOLOGY REPORT*  Clinical Data: Port-A-Cath placement  PORTABLE CHEST - 1 VIEW  Comparison:  None.  Findings: Right internal jugular vein Port-A-Cath has been placed. The tip is in the lower SVC.  No pneumothorax.  Emphysema is present in the right axilla and over the right chest wall.  Mild cardiomegaly.  Vascular congestion.  No consolidation.  IMPRESSION: Right internal jugular vein Port-A-Cath placement with its tip in the lower SVC and no pneumothorax.  Subcutaneous emphysema over the right chest wall is noted.   Original Report Authenticated By: Donavan Burnet, M.D.    Dg Fluoro Guide Cv Line-no Report  06/29/2012  CLINICAL DATA: pportacath   FLOURO GUIDE CV LINE  Fluoroscopy was utilized by the requesting physician.  No radiographic  interpretation.      ASSESSMENT: 61 year old female with  #1Stage II a (T2 N0) invasive lobular carcinoma of the right breast. Patient is now here for neoadjuvant chemotherapy consisting of Taxotere and Cytoxan. She is here for cycle #1 with day 2 Neulasta. She had a Port-A-Cath placed. She has had her chemotherapy teaching class performed. She also has all of her antiemetics at her pharmacy and she will pick these up. Patient did undergo a sentinel lymph node biopsy results of which are pending at this time. Patient understands the risks and benefits of treatment and the rationale for neoadjuvant therapy. A total of 4 cycles of neoadjuvant therapy is planned.   PLAN:   #1 Overall patient tolerated her chemotherapy quite well. Her blood count looks great with a white count of 12.  #2 patient will return in 2 weeks' time for her next cycle of Taxotere and Cytoxan. Because patient is node-positive we did discuss the possibility of giving her 6 cycles of TC due to her high risk disease.  All questions were answered. The patient knows to call the clinic with any problems, questions or concerns. We can certainly see the patient much sooner if necessary.  I spent 25 minutes counseling the patient face to face. The total time spent in the appointment was 30  minutes.    Drue Second, MD Medical/Oncology [facility] [phone] (beeper) [phone] (Office)  07/08/2012, 10:29 AM

## 2012-07-08 NOTE — Telephone Encounter (Signed)
As of 07-08-2012 nothing to schedule

## 2012-07-15 ENCOUNTER — Ambulatory Visit (INDEPENDENT_AMBULATORY_CARE_PROVIDER_SITE_OTHER): Payer: BC Managed Care – PPO | Admitting: General Surgery

## 2012-07-15 ENCOUNTER — Encounter (INDEPENDENT_AMBULATORY_CARE_PROVIDER_SITE_OTHER): Payer: Self-pay | Admitting: General Surgery

## 2012-07-15 VITALS — BP 126/80 | HR 66 | Temp 99.0°F | Resp 18 | Ht 63.0 in | Wt 125.6 lb

## 2012-07-15 DIAGNOSIS — Z9889 Other specified postprocedural states: Secondary | ICD-10-CM

## 2012-07-15 NOTE — Progress Notes (Signed)
Operation:  Right axillary sentinel lymph node biopsy and Port-A-Cath insertion  Date:  June 29, 2012  Pathology:  T2N1  HPI:  She is here for her first postoperative visit. She has some soreness at the axillary incision site and altered sensation in the right triceps area.  She is aware of her pathology.   Physical Exam: Chest- Port site is clean and intact.  Extremity-right axillary wound is clean and intact.  Assessment:  T2N1 invasive lobular cancer of the right breast-neoadjuvant chemotherapy has been started.  Plan:  Return visit to me when her chemotherapy is near completion so we can discuss definitive surgery.

## 2012-07-15 NOTE — Patient Instructions (Signed)
Activities as tolerated with right arm. 

## 2012-07-17 ENCOUNTER — Encounter (HOSPITAL_COMMUNITY): Payer: Self-pay

## 2012-07-17 ENCOUNTER — Encounter (HOSPITAL_COMMUNITY)
Admission: RE | Admit: 2012-07-17 | Discharge: 2012-07-17 | Disposition: A | Discharge status: Home / Self Care | Payer: BC Managed Care – PPO | Source: Ambulatory Visit | Attending: Oncology | Admitting: Oncology

## 2012-07-17 DIAGNOSIS — C50919 Malignant neoplasm of unspecified site of unspecified female breast: Secondary | ICD-10-CM | POA: Insufficient documentation

## 2012-07-17 DIAGNOSIS — K7689 Other specified diseases of liver: Secondary | ICD-10-CM | POA: Insufficient documentation

## 2012-07-17 LAB — GLUCOSE, CAPILLARY: Glucose-Capillary: 102 mg/dL — ABNORMAL HIGH (ref 70–99)

## 2012-07-17 MED ORDER — FLUDEOXYGLUCOSE F - 18 (FDG) INJECTION
18.3000 | Freq: Once | INTRAVENOUS | Status: AC | PRN
  Administered 2012-07-17: 18.3 via INTRAVENOUS

## 2012-07-20 ENCOUNTER — Encounter: Payer: Self-pay | Admitting: Oncology

## 2012-07-20 NOTE — Progress Notes (Signed)
Put fmla form on nurse's desk °

## 2012-07-21 ENCOUNTER — Encounter: Payer: Self-pay | Admitting: Oncology

## 2012-07-21 NOTE — Progress Notes (Signed)
Put fmla form in registration desk °

## 2012-07-22 ENCOUNTER — Ambulatory Visit (HOSPITAL_BASED_OUTPATIENT_CLINIC_OR_DEPARTMENT_OTHER): Payer: BC Managed Care – PPO | Admitting: Oncology

## 2012-07-22 ENCOUNTER — Ambulatory Visit (HOSPITAL_BASED_OUTPATIENT_CLINIC_OR_DEPARTMENT_OTHER): Payer: BC Managed Care – PPO

## 2012-07-22 ENCOUNTER — Encounter: Payer: Self-pay | Admitting: Oncology

## 2012-07-22 ENCOUNTER — Other Ambulatory Visit (HOSPITAL_BASED_OUTPATIENT_CLINIC_OR_DEPARTMENT_OTHER): Payer: BC Managed Care – PPO | Admitting: Lab

## 2012-07-22 VITALS — BP 134/82 | HR 62 | Temp 98.2°F | Resp 20 | Ht 63.0 in | Wt 125.5 lb

## 2012-07-22 DIAGNOSIS — C50419 Malignant neoplasm of upper-outer quadrant of unspecified female breast: Secondary | ICD-10-CM

## 2012-07-22 DIAGNOSIS — Z170 Estrogen receptor positive status [ER+]: Secondary | ICD-10-CM

## 2012-07-22 DIAGNOSIS — Z5111 Encounter for antineoplastic chemotherapy: Secondary | ICD-10-CM

## 2012-07-22 LAB — CBC WITH DIFFERENTIAL/PLATELET
BASO%: 0.1 % (ref 0.0–2.0)
EOS%: 0 % (ref 0.0–7.0)
HCT: 37 % (ref 34.8–46.6)
MCH: 30.7 pg (ref 25.1–34.0)
MCHC: 33.8 g/dL (ref 31.5–36.0)
MCV: 90.9 fL (ref 79.5–101.0)
MONO%: 1.6 % (ref 0.0–14.0)
NEUT%: 91.5 % — ABNORMAL HIGH (ref 38.4–76.8)
lymph#: 0.6 10*3/uL — ABNORMAL LOW (ref 0.9–3.3)

## 2012-07-22 LAB — COMPREHENSIVE METABOLIC PANEL (CC13)
AST: 23 U/L (ref 5–34)
Albumin: 4.1 g/dL (ref 3.5–5.0)
Alkaline Phosphatase: 157 U/L — ABNORMAL HIGH (ref 40–150)
BUN: 15 mg/dL (ref 7.0–26.0)
Creatinine: 0.8 mg/dL (ref 0.6–1.1)
Glucose: 136 mg/dl — ABNORMAL HIGH (ref 70–99)
Potassium: 3.9 mEq/L (ref 3.5–5.1)
Total Bilirubin: 0.7 mg/dL (ref 0.20–1.20)

## 2012-07-22 MED ORDER — HEPARIN SOD (PORK) LOCK FLUSH 100 UNIT/ML IV SOLN
500.0000 [IU] | Freq: Once | INTRAVENOUS | Status: AC | PRN
  Administered 2012-07-22: 500 [IU]
  Filled 2012-07-22: qty 5

## 2012-07-22 MED ORDER — SODIUM CHLORIDE 0.9 % IV SOLN
600.0000 mg/m2 | Freq: Once | INTRAVENOUS | Status: AC
  Administered 2012-07-22: 940 mg via INTRAVENOUS
  Filled 2012-07-22: qty 47

## 2012-07-22 MED ORDER — LORAZEPAM 2 MG/ML IJ SOLN
0.5000 mg | Freq: Once | INTRAMUSCULAR | Status: AC
  Administered 2012-07-22: 0.5 mg via INTRAVENOUS

## 2012-07-22 MED ORDER — DEXAMETHASONE SODIUM PHOSPHATE 4 MG/ML IJ SOLN
20.0000 mg | Freq: Once | INTRAMUSCULAR | Status: AC
  Administered 2012-07-22: 20 mg via INTRAVENOUS

## 2012-07-22 MED ORDER — SODIUM CHLORIDE 0.9 % IV SOLN
Freq: Once | INTRAVENOUS | Status: AC
  Administered 2012-07-22: 10:00:00 via INTRAVENOUS

## 2012-07-22 MED ORDER — ONDANSETRON 16 MG/50ML IVPB (CHCC)
16.0000 mg | Freq: Once | INTRAVENOUS | Status: AC
  Administered 2012-07-22: 16 mg via INTRAVENOUS

## 2012-07-22 MED ORDER — SODIUM CHLORIDE 0.9 % IJ SOLN
10.0000 mL | INTRAMUSCULAR | Status: DC | PRN
  Administered 2012-07-22: 10 mL
  Filled 2012-07-22: qty 10

## 2012-07-22 MED ORDER — DOCETAXEL CHEMO INJECTION 160 MG/16ML
75.0000 mg/m2 | Freq: Once | INTRAVENOUS | Status: AC
  Administered 2012-07-22: 120 mg via INTRAVENOUS
  Filled 2012-07-22: qty 12

## 2012-07-22 NOTE — Progress Notes (Signed)
OFFICE PROGRESS NOTE  CC  [name], MD [address] [place] [address] Dr. Avel Peace Dr. Lurline Hare   DIAGNOSIS: 61 year old female with invasive lobular carcinoma of the right breast.  PRIOR THERAPY:  #1 patient was originally seen in the multidisciplinary breast clinic on a 8/28/ 2013 for a stage II a invasive lobular carcinoma that was ER positive.  #2 at the time it was recommended patient proceed with neoadjuvant chemotherapy. But did want A. Upfront sentinel lymph node biopsy and a Port-A-Cath placed. She had this done 2 days ago.  #3 she is now being seen for start of cycle 1 day 1 of neoadjuvant Taxotere and Cytoxan. A total of 6 cycles is planned.  CURRENT THERAPY: cycle 2 day 1 of neoadjuvant Taxotere and Cytoxan with day 2 Neulasta  INTERVAL HISTORY: Cassie Carney 61 y.o. female returns forFollowup visit prior to her chemotherapy today. Overall she is doing well. She does not know the results of her sentinel lymph node. She denies any nausea vomiting fevers chills night sweats headaches no shortness of breath no chest pains no palpitations no fevers. She does have a little bit tenderness at the port site. Remainder of the 10 point review of systems is negative.  MEDICAL HISTORY: Past Medical History  Diagnosis Date  . Breast cancer   . Mitral valve prolapse   . Night sweats   . Anxiety   . Hot flashes   . PONV (postoperative nausea and vomiting)   . Arthritis     ALLERGIES:  is allergic to adhesive.  MEDICATIONS:  Current Outpatient Prescriptions  Medication Sig Dispense Refill  . ALPRAZolam (XANAX) 0.5 MG tablet Take 0.5 mg by mouth 3 (three) times daily as needed.      . metoprolol succinate (TOPROL-XL) 25 MG 24 hr tablet Take 25 mg by mouth at bedtime.       Marland Kitchen UNABLE TO FIND Cranial Prothesis  1 each  0    SURGICAL HISTORY:  Past Surgical History  Procedure Date  . Broken arm   . Broken wrist   . Dilation and  curettage of uterus   . Portacath placement 06/29/2012    Procedure: INSERTION PORT-A-CATH;  Surgeon: Adolph Pollack, MD;  Location: [facility];  Service: General;  Laterality: N/A;   ultrasound guided Port-A-Cath insertion    REVIEW OF SYSTEMS:  Pertinent items are noted in HPI.   PHYSICAL EXAMINATION:  Well-developed well-nourished female in no acute distress a little bit anxious.  HEENT exam EOMI PERRLA sclerae anicteric no conjunctival pallor oral mucosa is moist neck is supple lungs are clear bilaterally to auscultation and percussion cardiovascular is regular rate rhythm no murmurs gallops or rubs abdomen is soft nontender nondistended bowel sounds are present no HSM extremities no clubbing edema or cyanosis neuro patient's alert oriented otherwise nonfocal. Skin no rashes.  Right breast palpable mass port site looks good  ECOG PERFORMANCE STATUS: 0 - Asymptomatic  Blood pressure 134/82, pulse 62, temperature 98.2 F (36.8 C), resp. rate 20, height 5\' 3"  (1.6 m), weight 125 lb 8 oz (56.926 kg).  LABORATORY DATA: Lab Results  Component Value Date   WBC 9.3 07/22/2012   HGB 12.5 07/22/2012   HCT 37.0 07/22/2012   MCV 90.9 07/22/2012   PLT 327 07/22/2012      Chemistry      Component Value Date/Time   NA 141 07/08/2012 0912   NA 141 06/26/2012 0830   K  4.0 07/08/2012 0912   K 4.3 06/26/2012 0830   CL 108* 07/08/2012 0912   CL 105 06/26/2012 0830   CO2 25 07/08/2012 0912   CO2 26 06/26/2012 0830   BUN 8.0 07/08/2012 0912   BUN 12 06/26/2012 0830   CREATININE 0.8 07/08/2012 0912   CREATININE 0.74 06/26/2012 0830      Component Value Date/Time   CALCIUM 9.6 07/08/2012 0912   CALCIUM 10.2 06/26/2012 0830   ALKPHOS 282* 07/08/2012 0912   ALKPHOS 99 06/26/2012 0830   AST 84* 07/08/2012 0912   AST 30 06/26/2012 0830   ALT 251 Repeated and Verified* 07/08/2012 0912   ALT 41* 06/26/2012 0830   BILITOT 0.40 07/08/2012 0912   BILITOT 0.4 06/26/2012 0830       RADIOGRAPHIC  STUDIES:   ASSESSMENT: 61 year old female with  #1Stage II a (T2 N0) invasive lobular carcinoma of the right breast. Patient is now here for neoadjuvant chemotherapy consisting of Taxotere and Cytoxan. She is here for cycle #1 with day 2 Neulasta. She had a Port-A-Cath placed. She has had her chemotherapy teaching class performed. She also has all of her antiemetics at her pharmacy and she will pick these up. Patient did undergo a sentinel lymph node biopsy results of which are pending at this time. Patient understands the risks and benefits of treatment and the rationale for neoadjuvant therapy. A total of 6 cycles of neoadjuvant therapy is planned.   PLAN:  #1 patient will proceed with cycle #2 of Taxotere and Cytoxan. She will return tomorrow for Neulasta. She knows to take Claritin and Tylenol prior to coming in for her Neulasta.  #2 we discussed results of the PET scan. She has no evidence of metastatic disease anywhere else.  #3 she will return in one week's time for interim labs and office visit  All questions were answered. The patient knows to call the clinic with any problems, questions or concerns. We can certainly see the patient much sooner if necessary.  I spent 25 minutes counseling the patient face to face. The total time spent in the appointment was 30 minutes.    Drue Second, MD Medical/Oncology [facility] [phone] (beeper) [phone] (Office)  07/22/2012, 9:45 AM

## 2012-07-22 NOTE — Patient Instructions (Signed)
[facility] Discharge Instructions for Patients Receiving Chemotherapy  Today you received the following chemotherapy agents: Taxotere, Cytoxan  To help prevent nausea and vomiting after your treatment, we encourage you to take your nausea medication as directed by your MD. If you develop nausea and vomiting that is not controlled by your nausea medication, call the clinic. If it is after clinic hours your family physician or the after hours number for the clinic or go to the Emergency Department.   BELOW ARE SYMPTOMS THAT SHOULD BE REPORTED IMMEDIATELY:  *FEVER GREATER THAN 100.5 F  *CHILLS WITH OR WITHOUT FEVER  NAUSEA AND VOMITING THAT IS NOT CONTROLLED WITH YOUR NAUSEA MEDICATION  *UNUSUAL SHORTNESS OF BREATH  *UNUSUAL BRUISING OR BLEEDING  TENDERNESS IN MOUTH AND THROAT WITH OR WITHOUT PRESENCE OF ULCERS  *URINARY PROBLEMS  *BOWEL PROBLEMS  UNUSUAL RASH Items with * indicate a potential emergency and should be followed up as soon as possible.  Feel free to call the clinic you have any questions or concerns. The clinic phone number is [phone].    

## 2012-07-22 NOTE — Patient Instructions (Addendum)
Proceed with cycle 2 of chemotherapy  Your PET scan looks good no evidence of metastatic cancer  I will see you back in 1 week

## 2012-07-23 ENCOUNTER — Ambulatory Visit (HOSPITAL_BASED_OUTPATIENT_CLINIC_OR_DEPARTMENT_OTHER): Payer: BC Managed Care – PPO

## 2012-07-23 VITALS — BP 123/76 | HR 75 | Temp 97.6°F

## 2012-07-23 DIAGNOSIS — C50419 Malignant neoplasm of upper-outer quadrant of unspecified female breast: Secondary | ICD-10-CM

## 2012-07-23 DIAGNOSIS — Z5189 Encounter for other specified aftercare: Secondary | ICD-10-CM

## 2012-07-23 MED ORDER — PEGFILGRASTIM INJECTION 6 MG/0.6ML
6.0000 mg | Freq: Once | SUBCUTANEOUS | Status: AC
  Administered 2012-07-23: 6 mg via SUBCUTANEOUS
  Filled 2012-07-23: qty 0.6

## 2012-07-29 ENCOUNTER — Telehealth: Payer: Self-pay | Admitting: *Deleted

## 2012-07-29 ENCOUNTER — Ambulatory Visit (HOSPITAL_BASED_OUTPATIENT_CLINIC_OR_DEPARTMENT_OTHER): Payer: BC Managed Care – PPO | Admitting: Oncology

## 2012-07-29 ENCOUNTER — Other Ambulatory Visit (HOSPITAL_BASED_OUTPATIENT_CLINIC_OR_DEPARTMENT_OTHER): Payer: BC Managed Care – PPO | Admitting: Lab

## 2012-07-29 ENCOUNTER — Encounter: Payer: Self-pay | Admitting: Oncology

## 2012-07-29 VITALS — BP 109/67 | HR 68 | Temp 97.4°F | Resp 20 | Ht 63.0 in | Wt 127.4 lb

## 2012-07-29 DIAGNOSIS — D72829 Elevated white blood cell count, unspecified: Secondary | ICD-10-CM

## 2012-07-29 DIAGNOSIS — C50419 Malignant neoplasm of upper-outer quadrant of unspecified female breast: Secondary | ICD-10-CM

## 2012-07-29 DIAGNOSIS — R5381 Other malaise: Secondary | ICD-10-CM

## 2012-07-29 DIAGNOSIS — Z170 Estrogen receptor positive status [ER+]: Secondary | ICD-10-CM

## 2012-07-29 LAB — CBC WITH DIFFERENTIAL/PLATELET
Basophils Absolute: 0.1 10*3/uL (ref 0.0–0.1)
EOS%: 0.6 % (ref 0.0–7.0)
Eosinophils Absolute: 0.1 10*3/uL (ref 0.0–0.5)
HCT: 36.5 % (ref 34.8–46.6)
HGB: 12.2 g/dL (ref 11.6–15.9)
LYMPH%: 6.9 % — ABNORMAL LOW (ref 14.0–49.7)
MCH: 31.2 pg (ref 25.1–34.0)
MCV: 93.1 fL (ref 79.5–101.0)
MONO%: 13.9 % (ref 0.0–14.0)
NEUT#: 16.9 10*3/uL — ABNORMAL HIGH (ref 1.5–6.5)
NEUT%: 78.3 % — ABNORMAL HIGH (ref 38.4–76.8)
Platelets: 144 10*3/uL — ABNORMAL LOW (ref 145–400)

## 2012-07-29 LAB — COMPREHENSIVE METABOLIC PANEL (CC13)
AST: 132 U/L — ABNORMAL HIGH (ref 5–34)
Albumin: 3.3 g/dL — ABNORMAL LOW (ref 3.5–5.0)
Alkaline Phosphatase: 265 U/L — ABNORMAL HIGH (ref 40–150)
BUN: 8 mg/dL (ref 7.0–26.0)
Calcium: 9.1 mg/dL (ref 8.4–10.4)
Creatinine: 0.7 mg/dL (ref 0.6–1.1)
Glucose: 96 mg/dl (ref 70–99)
Potassium: 4 mEq/L (ref 3.5–5.1)

## 2012-07-29 NOTE — Telephone Encounter (Signed)
Per staff message and POF I have scheduled appts.  JMW  

## 2012-07-29 NOTE — Progress Notes (Signed)
OFFICE PROGRESS NOTE  CC  [name], MD [address] [place] [address] Dr. Avel Peace Dr. Lurline Hare   DIAGNOSIS: 61 year old female with invasive lobular carcinoma of the right breast.  PRIOR THERAPY:  #1 patient was originally seen in the multidisciplinary breast clinic on a 8/28/ 2013 for a stage II a invasive lobular carcinoma that was ER positive.  #2 at the time it was recommended patient proceed with neoadjuvant chemotherapy. But did want A. Upfront sentinel lymph node biopsy and a Port-A-Cath placed. She had this done 2 days ago.  #3 she is now being seen for start of cycle 1 day 1 of neoadjuvant Taxotere and Cytoxan. A total of 6 cycles is planned.  CURRENT THERAPY: Status post cycle 2 day 1 of neoadjuvant Taxotere and Cytoxan with day 2 Neulasta  INTERVAL HISTORY: Cassie Carney 61 y.o. female returns forFollowup visit after her cycle 2   Of chemotherapy last week.  patient is tired, otherwise doing well. She denies any nausea vomiting fevers chills night sweats headaches no shortness of breath no chest pains no palpitations no fevers.  Remainder of the 10 point review of systems is negative.  MEDICAL HISTORY: Past Medical History  Diagnosis Date  . Breast cancer   . Mitral valve prolapse   . Night sweats   . Anxiety   . Hot flashes   . PONV (postoperative nausea and vomiting)   . Arthritis     ALLERGIES:  is allergic to adhesive.  MEDICATIONS:  Current Outpatient Prescriptions  Medication Sig Dispense Refill  . ALPRAZolam (XANAX) 0.5 MG tablet Take 0.5 mg by mouth 3 (three) times daily as needed.      . metoprolol succinate (TOPROL-XL) 25 MG 24 hr tablet Take 25 mg by mouth at bedtime.       Marland Kitchen UNABLE TO FIND Cranial Prothesis  1 each  0    SURGICAL HISTORY:  Past Surgical History  Procedure Date  . Broken arm   . Broken wrist   . Dilation and curettage of uterus   . Portacath placement 06/29/2012    Procedure: INSERTION  PORT-A-CATH;  Surgeon: Adolph Pollack, MD;  Location: [facility];  Service: General;  Laterality: N/A;   ultrasound guided Port-A-Cath insertion    REVIEW OF SYSTEMS:  Pertinent items are noted in HPI.   PHYSICAL EXAMINATION:  Well-developed well-nourished female in no acute distress a little bit anxious.  HEENT exam EOMI PERRLA sclerae anicteric no conjunctival pallor oral mucosa is moist neck is supple lungs are clear bilaterally to auscultation and percussion cardiovascular is regular rate rhythm no murmurs gallops or rubs abdomen is soft nontender nondistended bowel sounds are present no HSM extremities no clubbing edema or cyanosis neuro patient's alert oriented otherwise nonfocal. Skin no rashes.  Right breast palpable mass port site looks good  ECOG PERFORMANCE STATUS: 0 - Asymptomatic  Blood pressure 109/67, pulse 68, temperature 97.4 F (36.3 C), resp. rate 20, height 5\' 3"  (1.6 m), weight 127 lb 6.4 oz (57.788 kg).  LABORATORY DATA: Lab Results  Component Value Date   WBC 21.6* 07/29/2012   HGB 12.2 07/29/2012   HCT 36.5 07/29/2012   MCV 93.1 07/29/2012   PLT 144* 07/29/2012      Chemistry      Component Value Date/Time   NA 138 07/22/2012 0851   NA 141 06/26/2012 0830   K 3.9 07/22/2012 0851   K 4.3 06/26/2012 0830   CL  107 07/22/2012 0851   CL 105 06/26/2012 0830   CO2 20* 07/22/2012 0851   CO2 26 06/26/2012 0830   BUN 15.0 07/22/2012 0851   BUN 12 06/26/2012 0830   CREATININE 0.8 07/22/2012 0851   CREATININE 0.74 06/26/2012 0830      Component Value Date/Time   CALCIUM 10.2 07/22/2012 0851   CALCIUM 10.2 06/26/2012 0830   ALKPHOS 157* 07/22/2012 0851   ALKPHOS 99 06/26/2012 0830   AST 23 07/22/2012 0851   AST 30 06/26/2012 0830   ALT 46 07/22/2012 0851   ALT 41* 06/26/2012 0830   BILITOT 0.70 07/22/2012 0851   BILITOT 0.4 06/26/2012 0830       RADIOGRAPHIC STUDIES:   ASSESSMENT: 61 year old female with  #1Stage II a (T2 N0) invasive lobular carcinoma of the right  breast. Patient is now here for neoadjuvant chemotherapy consisting of Taxotere and Cytoxan. Status post cycle #2 with day 2 Neulasta. Overall she's tolerated it well.  #2 fatigue due to chemotherapy.  #3 leukocytosis Neulasta induced.  PLAN:   #1 overall patient is doing well post chemotherapy.  #2 she will be seen back in 2 weeks' time for cycle #3 of her chemotherapy.   All questions were answered. The patient knows to call the clinic with any problems, questions or concerns. We can certainly see the patient much sooner if necessary.  I spent 25 minutes counseling the patient face to face. The total time spent in the appointment was 30 minutes.    Drue Second, MD Medical/Oncology [facility] [phone] (beeper) [phone] (Office)  07/29/2012, 11:35 AM

## 2012-07-29 NOTE — Patient Instructions (Addendum)
Doing well  We will see you back in 10/23

## 2012-08-11 ENCOUNTER — Ambulatory Visit (HOSPITAL_BASED_OUTPATIENT_CLINIC_OR_DEPARTMENT_OTHER): Payer: BC Managed Care – PPO | Admitting: Adult Health

## 2012-08-11 ENCOUNTER — Other Ambulatory Visit (HOSPITAL_BASED_OUTPATIENT_CLINIC_OR_DEPARTMENT_OTHER): Payer: BC Managed Care – PPO | Admitting: Lab

## 2012-08-11 ENCOUNTER — Encounter: Payer: Self-pay | Admitting: Adult Health

## 2012-08-11 VITALS — BP 113/70 | HR 69 | Temp 98.0°F | Resp 20 | Ht 63.0 in | Wt 130.7 lb

## 2012-08-11 DIAGNOSIS — Z170 Estrogen receptor positive status [ER+]: Secondary | ICD-10-CM

## 2012-08-11 DIAGNOSIS — C50419 Malignant neoplasm of upper-outer quadrant of unspecified female breast: Secondary | ICD-10-CM

## 2012-08-11 DIAGNOSIS — R5383 Other fatigue: Secondary | ICD-10-CM

## 2012-08-11 LAB — COMPREHENSIVE METABOLIC PANEL (CC13)
Albumin: 3.5 g/dL (ref 3.5–5.0)
BUN: 14 mg/dL (ref 7.0–26.0)
Calcium: 9.2 mg/dL (ref 8.4–10.4)
Chloride: 109 mEq/L — ABNORMAL HIGH (ref 98–107)
Creatinine: 0.7 mg/dL (ref 0.6–1.1)
Glucose: 87 mg/dl (ref 70–99)
Potassium: 4.3 mEq/L (ref 3.5–5.1)

## 2012-08-11 LAB — CBC WITH DIFFERENTIAL/PLATELET
Basophils Absolute: 0.1 10*3/uL (ref 0.0–0.1)
Eosinophils Absolute: 0 10*3/uL (ref 0.0–0.5)
HCT: 34.8 % (ref 34.8–46.6)
HGB: 11.9 g/dL (ref 11.6–15.9)
MCH: 32.3 pg (ref 25.1–34.0)
MCV: 95 fL (ref 79.5–101.0)
NEUT#: 4.4 10*3/uL (ref 1.5–6.5)
NEUT%: 73.2 % (ref 38.4–76.8)
RDW: 14.2 % (ref 11.2–14.5)
lymph#: 0.9 10*3/uL (ref 0.9–3.3)

## 2012-08-11 NOTE — Progress Notes (Signed)
OFFICE PROGRESS NOTE  CC  [name], MD [address] [place] [address] Dr. Avel Peace Dr. Lurline Hare   DIAGNOSIS: 61 year old female with invasive lobular carcinoma of the right breast.  PRIOR THERAPY:  #1 patient was originally seen in the multidisciplinary breast clinic on a 8/28/ 2013 for a stage II a invasive lobular carcinoma that was ER positive.  #2 at the time it was recommended patient proceed with neoadjuvant chemotherapy. But did want A. Upfront sentinel lymph node biopsy and a Port-A-Cath placed. She had this done 2 days ago.  #3 she is undergoing treatment with neoadjuvant Taxotere and Cytoxan. A total of 6 cycles is planned.  CURRENT THERAPY: Cycle 3 of Taxotere and cytoxan  INTERVAL HISTORY: Cassie Carney 61 y.o. female returns for follow up and an appointment before her third cycle of Taxotere and Cytoxan.  She developed a "red face" after her last chemotherapy treatment that resolved after one day and had no associated symptoms.  She also get fatigued with this chemotherapy, but denies numbness, tingling, or any other adverse effects.  She is ready to proceed with cycle 3.    MEDICAL HISTORY: Past Medical History  Diagnosis Date  . Breast cancer   . Mitral valve prolapse   . Night sweats   . Anxiety   . Hot flashes   . PONV (postoperative nausea and vomiting)   . Arthritis     ALLERGIES:  is allergic to adhesive.  MEDICATIONS:  Current Outpatient Prescriptions  Medication Sig Dispense Refill  . ALPRAZolam (XANAX) 0.5 MG tablet Take 0.5 mg by mouth 3 (three) times daily as needed.      . metoprolol succinate (TOPROL-XL) 25 MG 24 hr tablet Take 25 mg by mouth at bedtime.       Marland Kitchen UNABLE TO FIND Cranial Prothesis  1 each  0    SURGICAL HISTORY:  Past Surgical History  Procedure Date  . Broken arm   . Broken wrist   . Dilation and curettage of uterus   . Portacath placement 06/29/2012    Procedure: INSERTION PORT-A-CATH;   Surgeon: Adolph Pollack, MD;  Location: [facility];  Service: General;  Laterality: N/A;   ultrasound guided Port-A-Cath insertion    REVIEW OF SYSTEMS:   General: fatigue (-), night sweats (-), fever (-), pain (-) Lymph: palpable nodes (-) HEENT: vision changes (-), mucositis (-), gum bleeding (-), epistaxis (-) Cardiovascular: chest pain (-), palpitations (-) Pulmonary: shortness of breath (-), dyspnea on exertion (-), cough (-), hemoptysis (-) GI:  Early satiety (-), melena (-), dysphagia (-), nausea/vomiting (-), diarrhea (-) GU: dysuria (-), hematuria (-), incontinence (-) Musculoskeletal: joint swelling (-), joint pain (-), back pain (-) Neuro: weakness (-), numbness (-), headache (-), confusion (-) Skin: Rash (-), lesions (-), dryness (-) Psych: depression (-), suicidal/homicidal ideation (-), feeling of hopelessness (-)   PHYSICAL EXAMINATION:  BP 113/70  Pulse 69  Temp 98 F (36.7 C) (Oral)  Resp 20  Ht 5\' 3"  (1.6 m)  Wt 130 lb 11.2 oz (59.285 kg)  BMI 23.15 kg/m2 General: Patient is a well appearing female in no acute distress HEENT: PERRLA, sclerae anicteric no conjunctival pallor, MMM Neck: supple, no palpable adenopathy Lungs: clear to auscultation bilaterally, no wheezes, rhonchi, or rales Cardiovascular: regular rate rhythm, S1, S2, no murmurs, rubs or gallops Abdomen: Soft, non-tender, non-distended, normoactive bowel sounds, no HSM Extremities: warm and well perfused, no clubbing, cyanosis, or edema Skin: No  rashes or lesions Neuro: Non-focal Right breast palpable mass port site looks good ECOG PERFORMANCE STATUS: 0 - Asymptomatic    LABORATORY DATA: Lab Results  Component Value Date   WBC 6.1 08/11/2012   HGB 11.9 08/11/2012   HCT 34.8 08/11/2012   MCV 95.0 08/11/2012   PLT 271 08/11/2012      Chemistry      Component Value Date/Time   NA 139 07/29/2012 1110   NA 141 06/26/2012 0830   K 4.0 07/29/2012 1110   K 4.3 06/26/2012 0830     CL 108* 07/29/2012 1110   CL 105 06/26/2012 0830   CO2 22 07/29/2012 1110   CO2 26 06/26/2012 0830   BUN 8.0 07/29/2012 1110   BUN 12 06/26/2012 0830   CREATININE 0.7 07/29/2012 1110   CREATININE 0.74 06/26/2012 0830      Component Value Date/Time   CALCIUM 9.1 07/29/2012 1110   CALCIUM 10.2 06/26/2012 0830   ALKPHOS 265* 07/29/2012 1110   ALKPHOS 99 06/26/2012 0830   AST 132* 07/29/2012 1110   AST 30 06/26/2012 0830   ALT 431* 07/29/2012 1110   ALT 41* 06/26/2012 0830   BILITOT 0.40 07/29/2012 1110   BILITOT 0.4 06/26/2012 0830       RADIOGRAPHIC STUDIES:   ASSESSMENT: 61 year old female with  #1Stage II a (T2 N0) invasive lobular carcinoma of the right breast. Patient is now here for neoadjuvant chemotherapy consisting of Taxotere and Cytoxan.  #2 fatigue due to chemotherapy.  PLAN:   #1. Cassie Carney is doing well.  Her labs were reviewed with her in detail.  She is feeling well and ready to get started with cycle 3 of therapy tomorrow.    #2 she will be seen back in one week for an interim lab.   All questions were answered. The patient knows to call the clinic with any problems, questions or concerns. We can certainly see the patient much sooner if necessary.  I spent 15 minutes counseling the patient face to face. The total time spent in the appointment was 30 minutes.  This case was reviewed with Dr. Welton Flakes.   [name] Lyn Hollingshead, NP Medical Oncology [facility] Phone: [phone]  08/11/2012, 10:54 AM

## 2012-08-11 NOTE — Patient Instructions (Addendum)
Doing well.  Proceed with chemotherapy tomorrow.  We will see you back in one week.  Please call us if you have any questions or concerns.

## 2012-08-12 ENCOUNTER — Ambulatory Visit (HOSPITAL_BASED_OUTPATIENT_CLINIC_OR_DEPARTMENT_OTHER): Payer: BC Managed Care – PPO

## 2012-08-12 VITALS — BP 124/65 | HR 72 | Temp 97.9°F | Resp 17

## 2012-08-12 DIAGNOSIS — C50419 Malignant neoplasm of upper-outer quadrant of unspecified female breast: Secondary | ICD-10-CM

## 2012-08-12 DIAGNOSIS — Z5111 Encounter for antineoplastic chemotherapy: Secondary | ICD-10-CM

## 2012-08-12 MED ORDER — LORAZEPAM 2 MG/ML IJ SOLN
0.5000 mg | Freq: Once | INTRAMUSCULAR | Status: AC
  Administered 2012-08-12: 0.5 mg via INTRAVENOUS

## 2012-08-12 MED ORDER — DEXAMETHASONE SODIUM PHOSPHATE 4 MG/ML IJ SOLN
20.0000 mg | Freq: Once | INTRAMUSCULAR | Status: AC
  Administered 2012-08-12: 20 mg via INTRAVENOUS

## 2012-08-12 MED ORDER — HEPARIN SOD (PORK) LOCK FLUSH 100 UNIT/ML IV SOLN
500.0000 [IU] | Freq: Once | INTRAVENOUS | Status: AC | PRN
  Administered 2012-08-12: 500 [IU]
  Filled 2012-08-12: qty 5

## 2012-08-12 MED ORDER — SODIUM CHLORIDE 0.9 % IV SOLN
Freq: Once | INTRAVENOUS | Status: AC
  Administered 2012-08-12: 12:00:00 via INTRAVENOUS

## 2012-08-12 MED ORDER — DOCETAXEL CHEMO INJECTION 160 MG/16ML
75.0000 mg/m2 | Freq: Once | INTRAVENOUS | Status: AC
  Administered 2012-08-12: 120 mg via INTRAVENOUS
  Filled 2012-08-12: qty 12

## 2012-08-12 MED ORDER — SODIUM CHLORIDE 0.9 % IJ SOLN
10.0000 mL | INTRAMUSCULAR | Status: DC | PRN
  Administered 2012-08-12: 10 mL
  Filled 2012-08-12: qty 10

## 2012-08-12 MED ORDER — SODIUM CHLORIDE 0.9 % IV SOLN
600.0000 mg/m2 | Freq: Once | INTRAVENOUS | Status: AC
  Administered 2012-08-12: 940 mg via INTRAVENOUS
  Filled 2012-08-12: qty 47

## 2012-08-12 MED ORDER — ONDANSETRON 16 MG/50ML IVPB (CHCC)
16.0000 mg | Freq: Once | INTRAVENOUS | Status: AC
  Administered 2012-08-12: 16 mg via INTRAVENOUS

## 2012-08-12 NOTE — Patient Instructions (Signed)
[facility] Discharge Instructions for Patients Receiving Chemotherapy  Today you received the following chemotherapy agents Taxotere and Cytoxan  To help prevent nausea and vomiting after your treatment, we encourage you to take your nausea medication. Begin taking your nausea medication as often as prescribed for by Dr Khan.   If you develop nausea and vomiting that is not controlled by your nausea medication, call the clinic. If it is after clinic hours your family physician or the after hours number for the clinic or go to the Emergency Department.   BELOW ARE SYMPTOMS THAT SHOULD BE REPORTED IMMEDIATELY:  *FEVER GREATER THAN 100.5 F  *CHILLS WITH OR WITHOUT FEVER  NAUSEA AND VOMITING THAT IS NOT CONTROLLED WITH YOUR NAUSEA MEDICATION  *UNUSUAL SHORTNESS OF BREATH  *UNUSUAL BRUISING OR BLEEDING  TENDERNESS IN MOUTH AND THROAT WITH OR WITHOUT PRESENCE OF ULCERS  *URINARY PROBLEMS  *BOWEL PROBLEMS  UNUSUAL RASH Items with * indicate a potential emergency and should be followed up as soon as possible.  One of the nurses will contact you 24 hours after your treatment. Please let the nurse know about any problems that you may have experienced. Feel free to call the clinic you have any questions or concerns. The clinic phone number is [phone].   I have been informed and understand all the instructions given to me. I know to contact the clinic, my physician, or go to the Emergency Department if any problems should occur. I do not have any questions at this time, but understand that I may call the clinic during office hours   should I have any questions or need assistance in obtaining follow up care.    __________________________________________  _____________  __________ Signature of Patient or Authorized Representative            Date                   Time    __________________________________________ Nurse's Signature    

## 2012-08-13 ENCOUNTER — Ambulatory Visit (HOSPITAL_BASED_OUTPATIENT_CLINIC_OR_DEPARTMENT_OTHER): Payer: BC Managed Care – PPO

## 2012-08-13 VITALS — BP 105/69 | HR 71 | Temp 97.2°F

## 2012-08-13 DIAGNOSIS — Z5189 Encounter for other specified aftercare: Secondary | ICD-10-CM

## 2012-08-13 DIAGNOSIS — C50419 Malignant neoplasm of upper-outer quadrant of unspecified female breast: Secondary | ICD-10-CM

## 2012-08-13 MED ORDER — PEGFILGRASTIM INJECTION 6 MG/0.6ML
6.0000 mg | Freq: Once | SUBCUTANEOUS | Status: AC
  Administered 2012-08-13: 6 mg via SUBCUTANEOUS
  Filled 2012-08-13: qty 0.6

## 2012-08-19 ENCOUNTER — Encounter: Payer: Self-pay | Admitting: Adult Health

## 2012-08-19 ENCOUNTER — Ambulatory Visit (HOSPITAL_BASED_OUTPATIENT_CLINIC_OR_DEPARTMENT_OTHER): Payer: BC Managed Care – PPO | Admitting: Adult Health

## 2012-08-19 ENCOUNTER — Other Ambulatory Visit (HOSPITAL_BASED_OUTPATIENT_CLINIC_OR_DEPARTMENT_OTHER): Payer: BC Managed Care – PPO | Admitting: Lab

## 2012-08-19 VITALS — BP 92/60 | HR 71 | Temp 99.0°F | Resp 20 | Ht 63.0 in | Wt 128.2 lb

## 2012-08-19 DIAGNOSIS — R5383 Other fatigue: Secondary | ICD-10-CM

## 2012-08-19 DIAGNOSIS — R5381 Other malaise: Secondary | ICD-10-CM

## 2012-08-19 DIAGNOSIS — C50419 Malignant neoplasm of upper-outer quadrant of unspecified female breast: Secondary | ICD-10-CM

## 2012-08-19 LAB — CBC WITH DIFFERENTIAL/PLATELET
BASO%: 0.4 % (ref 0.0–2.0)
LYMPH%: 6.2 % — ABNORMAL LOW (ref 14.0–49.7)
MCH: 31.6 pg (ref 25.1–34.0)
MCHC: 33.4 g/dL (ref 31.5–36.0)
MCV: 94.7 fL (ref 79.5–101.0)
MONO%: 15.8 % — ABNORMAL HIGH (ref 0.0–14.0)
Platelets: 154 10*3/uL (ref 145–400)
RBC: 3.64 10*6/uL — ABNORMAL LOW (ref 3.70–5.45)
WBC: 16.8 10*3/uL — ABNORMAL HIGH (ref 3.9–10.3)

## 2012-08-19 NOTE — Progress Notes (Signed)
OFFICE PROGRESS NOTE  CC  Cassie Moron, MD [address] [place] [address] Dr. Avel Carney Dr. Lurline Carney   DIAGNOSIS: 61 year old female with invasive lobular carcinoma of the right breast.  PRIOR THERAPY:  #1 patient was originally seen in the multidisciplinary breast clinic on a 8/28/ 2013 for a stage II a invasive lobular carcinoma that was ER positive.  #2 at the time it was recommended patient proceed with neoadjuvant chemotherapy. But did want A. Upfront sentinel lymph node biopsy and a Port-A-Cath placed. She had this done 2 days ago.  #3 she is undergoing treatment with neoadjuvant Taxotere and Cytoxan. A total of 6 cycles is planned.  CURRENT THERAPY: Cycle 3 day 8 of Taxotere and cytoxan with Neulasta support  INTERVAL HISTORY: Cassie Carney 61 y.o. female returns for follow up after receiving her third cycle of Taxotere/Cytoxan last week.  She developed another red face, however it resolved with Burts bees facial cream.  She did have aches and pains with Neulasta, and insomnia with her Dexamethasone, but that has subsided.  She reports numbness in her toes and feet at night time.  It does not interfere with her ability to walk however.      MEDICAL HISTORY: Past Medical History  Diagnosis Date  . Breast cancer   . Mitral valve prolapse   . Night sweats   . Anxiety   . Hot flashes   . PONV (postoperative nausea and vomiting)   . Arthritis     ALLERGIES:  is allergic to adhesive.  MEDICATIONS:  Current Outpatient Prescriptions  Medication Sig Dispense Refill  . ALPRAZolam (XANAX) 0.5 MG tablet Take 0.5 mg by mouth 3 (three) times daily as needed.      . metoprolol succinate (TOPROL-XL) 25 MG 24 hr tablet Take 25 mg by mouth at bedtime.       Marland Kitchen UNABLE TO FIND Cranial Prothesis  1 each  0    SURGICAL HISTORY:  Past Surgical History  Procedure Date  . Broken arm   . Broken wrist   . Dilation and curettage of uterus   . Portacath  placement 06/29/2012    Procedure: INSERTION PORT-A-CATH;  Surgeon: Adolph Pollack, MD;  Location: [facility];  Service: General;  Laterality: N/A;   ultrasound guided Port-A-Cath insertion    REVIEW OF SYSTEMS:   General: fatigue (-), night sweats (-), fever (-), pain (-) Lymph: palpable nodes (-) HEENT: vision changes (-), mucositis (-), gum bleeding (-), epistaxis (-) Cardiovascular: chest pain (-), palpitations (-) Pulmonary: shortness of breath (-), dyspnea on exertion (-), cough (-), hemoptysis (-) GI:  Early satiety (-), melena (-), dysphagia (-), nausea/vomiting (-), diarrhea (-) GU: dysuria (-), hematuria (-), incontinence (-) Musculoskeletal: joint swelling (-), joint pain (-), back pain (-) Neuro: weakness (-), numbness (-), headache (-), confusion (-) Skin: Rash (-), lesions (-), dryness (-) Psych: depression (-), suicidal/homicidal ideation (-), feeling of hopelessness (-)   PHYSICAL EXAMINATION:  BP 92/60  Pulse 71  Temp 99 F (37.2 C) (Oral)  Resp 20  Ht 5\' 3"  (1.6 m)  Wt 128 lb 3.2 oz (58.151 kg)  BMI 22.71 kg/m2 General: Patient is a well appearing female in no acute distress HEENT: PERRLA, sclerae anicteric no conjunctival pallor, MMM Neck: supple, no palpable adenopathy Lungs: clear to auscultation bilaterally, no wheezes, rhonchi, or rales Cardiovascular: regular rate rhythm, S1, S2, no murmurs, rubs or gallops Abdomen: Soft, non-tender, non-distended, normoactive bowel sounds, no  HSM Extremities: warm and well perfused, no clubbing, cyanosis, or edema Skin: No rashes or lesions Neuro: Non-focal Right breast palpable mass port site looks good ECOG PERFORMANCE STATUS: 0 - Asymptomatic    LABORATORY DATA: Lab Results  Component Value Date   WBC 16.8* 08/19/2012   HGB 11.5* 08/19/2012   HCT 34.5* 08/19/2012   MCV 94.7 08/19/2012   PLT 154 08/19/2012      Chemistry      Component Value Date/Time   NA 139 08/11/2012 1025   NA 141  06/26/2012 0830   K 4.3 08/11/2012 1025   K 4.3 06/26/2012 0830   CL 109* 08/11/2012 1025   CL 105 06/26/2012 0830   CO2 22 08/11/2012 1025   CO2 26 06/26/2012 0830   BUN 14.0 08/11/2012 1025   BUN 12 06/26/2012 0830   CREATININE 0.7 08/11/2012 1025   CREATININE 0.74 06/26/2012 0830      Component Value Date/Time   CALCIUM 9.2 08/11/2012 1025   CALCIUM 10.2 06/26/2012 0830   ALKPHOS 176* 08/11/2012 1025   ALKPHOS 99 06/26/2012 0830   AST 22 08/11/2012 1025   AST 30 06/26/2012 0830   ALT 49 08/11/2012 1025   ALT 41* 06/26/2012 0830   BILITOT 0.60 08/11/2012 1025   BILITOT 0.4 06/26/2012 0830       RADIOGRAPHIC STUDIES:   ASSESSMENT: 61 year old female with  #1Stage II a (T2 N0) invasive lobular carcinoma of the right breast. Patient is now here for neoadjuvant chemotherapy consisting of Taxotere and Cytoxan.  #2 fatigue due to chemotherapy.  PLAN:   #1. [name] is doing well.  Her labs were reviewed with her in detail.  She tolerated her chemotherapy well.  She is not neutropenic.    #2 she will be seen back in two weeks for cycle 4.     All questions were answered. The patient knows to call the clinic with any problems, questions or concerns. We can certainly see the patient much sooner if necessary.  I spent 15 minutes counseling the patient face to face. The total time spent in the appointment was 30 minutes.  This case was reviewed with Dr. Welton Flakes.   Cassie Ouch Lyn Hollingshead, NP Medical Oncology [facility] Phone: [phone]  08/19/2012, 12:04 PM

## 2012-08-19 NOTE — Patient Instructions (Addendum)
Doing well.  Lab work looks good.  We will see you back on 11/13, please call if you have any questions or concerns.

## 2012-09-01 ENCOUNTER — Encounter: Payer: Self-pay | Admitting: *Deleted

## 2012-09-02 ENCOUNTER — Ambulatory Visit (HOSPITAL_BASED_OUTPATIENT_CLINIC_OR_DEPARTMENT_OTHER): Payer: BC Managed Care – PPO | Admitting: Oncology

## 2012-09-02 ENCOUNTER — Encounter: Payer: Self-pay | Admitting: Oncology

## 2012-09-02 ENCOUNTER — Other Ambulatory Visit (HOSPITAL_BASED_OUTPATIENT_CLINIC_OR_DEPARTMENT_OTHER): Payer: BC Managed Care – PPO | Admitting: Lab

## 2012-09-02 ENCOUNTER — Ambulatory Visit (HOSPITAL_BASED_OUTPATIENT_CLINIC_OR_DEPARTMENT_OTHER): Payer: BC Managed Care – PPO

## 2012-09-02 VITALS — BP 129/81 | HR 69 | Temp 97.7°F | Resp 20 | Ht 63.0 in | Wt 131.9 lb

## 2012-09-02 DIAGNOSIS — C50419 Malignant neoplasm of upper-outer quadrant of unspecified female breast: Secondary | ICD-10-CM

## 2012-09-02 DIAGNOSIS — H04129 Dry eye syndrome of unspecified lacrimal gland: Secondary | ICD-10-CM

## 2012-09-02 DIAGNOSIS — R5381 Other malaise: Secondary | ICD-10-CM

## 2012-09-02 DIAGNOSIS — Z170 Estrogen receptor positive status [ER+]: Secondary | ICD-10-CM

## 2012-09-02 DIAGNOSIS — Z5111 Encounter for antineoplastic chemotherapy: Secondary | ICD-10-CM

## 2012-09-02 LAB — COMPREHENSIVE METABOLIC PANEL (CC13)
ALT: 99 U/L — ABNORMAL HIGH (ref 0–55)
CO2: 22 mEq/L (ref 22–29)
Calcium: 9.5 mg/dL (ref 8.4–10.4)
Chloride: 107 mEq/L (ref 98–107)
Creatinine: 0.7 mg/dL (ref 0.6–1.1)
Glucose: 126 mg/dl — ABNORMAL HIGH (ref 70–99)
Total Bilirubin: 0.46 mg/dL (ref 0.20–1.20)
Total Protein: 6.2 g/dL — ABNORMAL LOW (ref 6.4–8.3)

## 2012-09-02 LAB — CBC WITH DIFFERENTIAL/PLATELET
BASO%: 0.1 % (ref 0.0–2.0)
EOS%: 0 % (ref 0.0–7.0)
LYMPH%: 5.1 % — ABNORMAL LOW (ref 14.0–49.7)
MCH: 31 pg (ref 25.1–34.0)
MCHC: 33.1 g/dL (ref 31.5–36.0)
MONO#: 0.1 10*3/uL (ref 0.1–0.9)
RBC: 3.87 10*6/uL (ref 3.70–5.45)
WBC: 9.6 10*3/uL (ref 3.9–10.3)
lymph#: 0.5 10*3/uL — ABNORMAL LOW (ref 0.9–3.3)

## 2012-09-02 MED ORDER — SODIUM CHLORIDE 0.9 % IV SOLN
600.0000 mg/m2 | Freq: Once | INTRAVENOUS | Status: AC
  Administered 2012-09-02: 940 mg via INTRAVENOUS
  Filled 2012-09-02: qty 47

## 2012-09-02 MED ORDER — DEXAMETHASONE SODIUM PHOSPHATE 4 MG/ML IJ SOLN
20.0000 mg | Freq: Once | INTRAMUSCULAR | Status: AC
  Administered 2012-09-02: 20 mg via INTRAVENOUS

## 2012-09-02 MED ORDER — ONDANSETRON 16 MG/50ML IVPB (CHCC)
16.0000 mg | Freq: Once | INTRAVENOUS | Status: AC
  Administered 2012-09-02: 16 mg via INTRAVENOUS

## 2012-09-02 MED ORDER — SODIUM CHLORIDE 0.9 % IJ SOLN
10.0000 mL | INTRAMUSCULAR | Status: AC | PRN
  Administered 2012-09-02: 10 mL
  Filled 2012-09-02: qty 10

## 2012-09-02 MED ORDER — SODIUM CHLORIDE 0.9 % IV SOLN
Freq: Once | INTRAVENOUS | Status: AC
  Administered 2012-09-02: 14:00:00 via INTRAVENOUS

## 2012-09-02 MED ORDER — LORAZEPAM 2 MG/ML IJ SOLN
0.5000 mg | Freq: Once | INTRAMUSCULAR | Status: AC
  Administered 2012-09-02: 0.5 mg via INTRAVENOUS

## 2012-09-02 MED ORDER — HEPARIN SOD (PORK) LOCK FLUSH 100 UNIT/ML IV SOLN
500.0000 [IU] | Freq: Once | INTRAVENOUS | Status: AC | PRN
  Administered 2012-09-02: 500 [IU]
  Filled 2012-09-02: qty 5

## 2012-09-02 MED ORDER — DOCETAXEL CHEMO INJECTION 160 MG/16ML
75.0000 mg/m2 | Freq: Once | INTRAVENOUS | Status: AC
  Administered 2012-09-02: 120 mg via INTRAVENOUS
  Filled 2012-09-02: qty 12

## 2012-09-02 NOTE — Patient Instructions (Addendum)
Proceed with cycle 4 of TC  Artificial tear drops for dryness of the eyes  We will see you back in 1 week

## 2012-09-02 NOTE — Patient Instructions (Signed)
[facility] Discharge Instructions for Patients Receiving Chemotherapy  Today you received the following chemotherapy agents: Taxotere and Cytoxan.  To help prevent nausea and vomiting after your treatment, we encourage you to take your nausea medication, Ondansetron.  Begin taking the morning of 09/03/12 and take it as often as prescribed for the next 48 hours. Take Compazine every six hours as needed for nausea.    If you develop nausea and vomiting that is not controlled by your nausea medication, call the clinic. If it is after clinic hours your family physician or the after hours number for the clinic or go to the Emergency Department.   BELOW ARE SYMPTOMS THAT SHOULD BE REPORTED IMMEDIATELY:  *FEVER GREATER THAN 100.5 F  *CHILLS WITH OR WITHOUT FEVER  NAUSEA AND VOMITING THAT IS NOT CONTROLLED WITH YOUR NAUSEA MEDICATION  *UNUSUAL SHORTNESS OF BREATH  *UNUSUAL BRUISING OR BLEEDING  TENDERNESS IN MOUTH AND THROAT WITH OR WITHOUT PRESENCE OF ULCERS  *URINARY PROBLEMS  *BOWEL PROBLEMS  UNUSUAL RASH Items with * indicate a potential emergency and should be followed up as soon as possible.  One of the nurses will contact you 24 hours after your treatment. Please let the nurse know about any problems that you may have experienced. Feel free to call the clinic you have any questions or concerns. The clinic phone number is [phone].   I have been informed and understand all the instructions given to me. I know to contact the clinic, my physician, or go to the Emergency Department if any problems should occur. I do not have any questions at this time, but understand that I may call the clinic during office hours   should I have any questions or need assistance in obtaining follow up care.    __________________________________________  _____________  __________ Signature of Patient or Authorized Representative            Date                    Time    __________________________________________ Nurse's Signature

## 2012-09-02 NOTE — Progress Notes (Signed)
OFFICE PROGRESS NOTE  CC  [name], MD [address] [place] [address] Dr. Avel Peace Dr. Lurline Hare   DIAGNOSIS: 61 year old female with invasive lobular carcinoma of the right breast.  PRIOR THERAPY:  #1 patient was originally seen in the multidisciplinary breast clinic on a 8/28/ 2013 for a stage II a invasive lobular carcinoma that was ER positive.  #2 at the time it was recommended patient proceed with neoadjuvant chemotherapy. But did want A. Upfront sentinel lymph node biopsy and a Port-A-Cath placed.   #3 she is undergoing treatment with neoadjuvant Taxotere and Cytoxan. A total of 6 cycles is planned.  CURRENT THERAPY: Cycle 4 day1 of Taxotere and cytoxan with Neulasta support  INTERVAL HISTORY: Cassie Carney 61 y.o. female returns for follow up for cycle 4 of TC    She did have aches and pains with Neulasta, and insomnia with her Dexamethasone, but that has subsided.  She reports numbness in her toes and feet at night time.  It does not interfere with her ability to walk however.  She also has developed tearing of the eyes. This is worsened over the last 1 week. She feels that her eyes are much drier than they had been previously. This could possibly be the Taxotere. She has not had any fevers chills or night sweats. She does have a facial rash today. She's had this several times.    MEDICAL HISTORY: Past Medical History  Diagnosis Date  . Breast cancer   . Mitral valve prolapse   . Night sweats   . Anxiety   . Hot flashes   . PONV (postoperative nausea and vomiting)   . Arthritis     ALLERGIES:  is allergic to adhesive.  MEDICATIONS:  Current Outpatient Prescriptions  Medication Sig Dispense Refill  . ALPRAZolam (XANAX) 0.5 MG tablet Take 0.5 mg by mouth 3 (three) times daily as needed.      . metoprolol succinate (TOPROL-XL) 25 MG 24 hr tablet Take 25 mg by mouth at bedtime.       Marland Kitchen UNABLE TO FIND Cranial Prothesis  1 each  0     SURGICAL HISTORY:  Past Surgical History  Procedure Date  . Broken arm   . Broken wrist   . Dilation and curettage of uterus   . Portacath placement 06/29/2012    Procedure: INSERTION PORT-A-CATH;  Surgeon: Adolph Pollack, MD;  Location: [facility];  Service: General;  Laterality: N/A;   ultrasound guided Port-A-Cath insertion    REVIEW OF SYSTEMS:   General: fatigue (-), night sweats (-), fever (-), pain (-) Lymph: palpable nodes (-) HEENT: vision changes (-), mucositis (-), gum bleeding (-), epistaxis (-) Cardiovascular: chest pain (-), palpitations (-) Pulmonary: shortness of breath (-), dyspnea on exertion (-), cough (-), hemoptysis (-) GI:  Early satiety (-), melena (-), dysphagia (-), nausea/vomiting (-), diarrhea (-) GU: dysuria (-), hematuria (-), incontinence (-) Musculoskeletal: joint swelling (-), joint pain (-), back pain (-) Neuro: weakness (-), numbness (-), headache (-), confusion (-) Skin: Rash (-), lesions (-), dryness (-) Psych: depression (-), suicidal/homicidal ideation (-), feeling of hopelessness (-)   PHYSICAL EXAMINATION:  BP 129/81  Pulse 69  Temp 97.7 F (36.5 C)  Resp 20  Ht 5\' 3"  (1.6 m)  Wt 131 lb 14.4 oz (59.829 kg)  BMI 23.36 kg/m2 General: Patient is a well appearing female in no acute distress HEENT: PERRLA, sclerae anicteric no conjunctival pallor, MMM Neck: supple,  no palpable adenopathy Lungs: clear to auscultation bilaterally, no wheezes, rhonchi, or rales Cardiovascular: regular rate rhythm, S1, S2, no murmurs, rubs or gallops Abdomen: Soft, non-tender, non-distended, normoactive bowel sounds, no HSM Extremities: warm and well perfused, no clubbing, cyanosis, or edema Skin: No rashes or lesions Neuro: Non-focal Right breast palpable mass port site looks good ECOG PERFORMANCE STATUS: 0 - Asymptomatic    LABORATORY DATA: Lab Results  Component Value Date   WBC 9.6 09/02/2012   HGB 12.0 09/02/2012   HCT 36.3  09/02/2012   MCV 93.8 09/02/2012   PLT 321 09/02/2012      Chemistry      Component Value Date/Time   NA 139 08/11/2012 1025   NA 141 06/26/2012 0830   K 4.3 08/11/2012 1025   K 4.3 06/26/2012 0830   CL 109* 08/11/2012 1025   CL 105 06/26/2012 0830   CO2 22 08/11/2012 1025   CO2 26 06/26/2012 0830   BUN 14.0 08/11/2012 1025   BUN 12 06/26/2012 0830   CREATININE 0.7 08/11/2012 1025   CREATININE 0.74 06/26/2012 0830      Component Value Date/Time   CALCIUM 9.2 08/11/2012 1025   CALCIUM 10.2 06/26/2012 0830   ALKPHOS 176* 08/11/2012 1025   ALKPHOS 99 06/26/2012 0830   AST 22 08/11/2012 1025   AST 30 06/26/2012 0830   ALT 49 08/11/2012 1025   ALT 41* 06/26/2012 0830   BILITOT 0.60 08/11/2012 1025   BILITOT 0.4 06/26/2012 0830       RADIOGRAPHIC STUDIES:   ASSESSMENT: 60 year old female with  #1Stage II a (T2 N0) invasive lobular carcinoma of the right breast. Patient is now here for neoadjuvant chemotherapy consisting of Taxotere and Cytoxan.  #2 fatigue due to chemotherapy.  #3 increased lacrimation and dryness of the eyes  PLAN:  #1 proceed with cycle 4 day 1 of Taxotere and Cytoxan. She will return tomorrow for Neulasta.  #2 patient is recommended to take artificial tear drops but if it does not improve her dryness of the eyes and I will refer her to an ophthalmologist.  #3 she will be seen back in one week's time.   All questions were answered. The patient knows to call the clinic with any problems, questions or concerns. We can certainly see the patient much sooner if necessary.  I spent 25 minutes counseling the patient face to face. The total time spent in the appointment was 30 minutes.  This case was reviewed with Dr. Welton Flakes.   [name] Lyn Hollingshead, NP Medical Oncology [facility] Phone: [phone]  09/02/2012, 12:35 PM

## 2012-09-03 ENCOUNTER — Ambulatory Visit (HOSPITAL_BASED_OUTPATIENT_CLINIC_OR_DEPARTMENT_OTHER): Payer: BC Managed Care – PPO

## 2012-09-03 VITALS — BP 106/55 | HR 67 | Temp 97.0°F

## 2012-09-03 DIAGNOSIS — C50419 Malignant neoplasm of upper-outer quadrant of unspecified female breast: Secondary | ICD-10-CM

## 2012-09-03 MED ORDER — PEGFILGRASTIM INJECTION 6 MG/0.6ML
6.0000 mg | Freq: Once | SUBCUTANEOUS | Status: AC
  Administered 2012-09-03: 6 mg via SUBCUTANEOUS
  Filled 2012-09-03: qty 0.6

## 2012-09-09 ENCOUNTER — Other Ambulatory Visit (HOSPITAL_BASED_OUTPATIENT_CLINIC_OR_DEPARTMENT_OTHER): Payer: BC Managed Care – PPO

## 2012-09-09 ENCOUNTER — Encounter: Payer: Self-pay | Admitting: Oncology

## 2012-09-09 ENCOUNTER — Ambulatory Visit (HOSPITAL_BASED_OUTPATIENT_CLINIC_OR_DEPARTMENT_OTHER): Payer: BC Managed Care – PPO | Admitting: Oncology

## 2012-09-09 ENCOUNTER — Telehealth: Payer: Self-pay | Admitting: *Deleted

## 2012-09-09 VITALS — BP 97/64 | HR 83 | Temp 98.4°F | Resp 20 | Ht 63.0 in | Wt 130.1 lb

## 2012-09-09 DIAGNOSIS — C50419 Malignant neoplasm of upper-outer quadrant of unspecified female breast: Secondary | ICD-10-CM

## 2012-09-09 DIAGNOSIS — Z170 Estrogen receptor positive status [ER+]: Secondary | ICD-10-CM

## 2012-09-09 DIAGNOSIS — R5383 Other fatigue: Secondary | ICD-10-CM

## 2012-09-09 DIAGNOSIS — G5790 Unspecified mononeuropathy of unspecified lower limb: Secondary | ICD-10-CM

## 2012-09-09 LAB — CBC WITH DIFFERENTIAL/PLATELET
Basophils Absolute: 0.1 10*3/uL (ref 0.0–0.1)
Eosinophils Absolute: 0.3 10*3/uL (ref 0.0–0.5)
HGB: 11.4 g/dL — ABNORMAL LOW (ref 11.6–15.9)
MCV: 95.1 fL (ref 79.5–101.0)
MONO%: 20.3 % — ABNORMAL HIGH (ref 0.0–14.0)
NEUT#: 6.6 10*3/uL — ABNORMAL HIGH (ref 1.5–6.5)
Platelets: 165 10*3/uL (ref 145–400)
RDW: 15.1 % — ABNORMAL HIGH (ref 11.2–14.5)

## 2012-09-09 LAB — COMPREHENSIVE METABOLIC PANEL (CC13)
Albumin: 3.2 g/dL — ABNORMAL LOW (ref 3.5–5.0)
Alkaline Phosphatase: 164 U/L — ABNORMAL HIGH (ref 40–150)
BUN: 12 mg/dL (ref 7.0–26.0)
Calcium: 9.3 mg/dL (ref 8.4–10.4)
Glucose: 96 mg/dl (ref 70–99)
Potassium: 4.3 mEq/L (ref 3.5–5.1)

## 2012-09-09 MED ORDER — GABAPENTIN 100 MG PO CAPS: ORAL_CAPSULE | ORAL | Status: DC

## 2012-09-09 NOTE — Patient Instructions (Addendum)
#  1Begin Neurontin 200 mg at bedtime.  #2 we will see her back in 2 weeks' time for cycle #5 day 1 of Taxotere and Cytoxan.  #3 call with any problems questions or concerns.

## 2012-09-09 NOTE — Telephone Encounter (Signed)
As of 09-09-2012 nothing needs to be scheduled

## 2012-09-09 NOTE — Progress Notes (Signed)
OFFICE PROGRESS NOTE  CC  [name], MD [address] [place] [address] Dr. Avel Peace Dr. Lurline Hare   DIAGNOSIS: 61 year old female with invasive lobular carcinoma of the right breast.  PRIOR THERAPY:  #1 patient was originally seen in the multidisciplinary breast clinic on a 8/28/ 2013 for a stage II a invasive lobular carcinoma that was ER positive.  #2 at the time it was recommended patient proceed with neoadjuvant chemotherapy. But did want A. Upfront sentinel lymph node biopsy and a Port-A-Cath placed.   #3 she is undergoing treatment with neoadjuvant Taxotere and Cytoxan. A total of 6 cycles is planned.  CURRENT THERAPY: Status post Cycle 4 day1 of Taxotere and cytoxan with Neulasta support. She is now here for day 8 interval lab  INTERVAL HISTORY: Cassie Carney 61 y.o. female returns for follow up. Her primary complaint is some numbness in her feet which bothers her most at nighttime. She has no numbness or tingling in her hands her finger nails. She otherwise denies any fevers chills or night sweats no headaches no shortness of breath no chest pains or palpitations. She has no back pain. No breast tenderness. Remainder of the 10 point review of systems is negative.  MEDICAL HISTORY: Past Medical History  Diagnosis Date  . Breast cancer   . Mitral valve prolapse   . Night sweats   . Anxiety   . Hot flashes   . PONV (postoperative nausea and vomiting)   . Arthritis     ALLERGIES:  is allergic to adhesive.  MEDICATIONS:  Current Outpatient Prescriptions  Medication Sig Dispense Refill  . ALPRAZolam (XANAX) 0.5 MG tablet Take 0.5 mg by mouth 3 (three) times daily as needed.      . metoprolol succinate (TOPROL-XL) 25 MG 24 hr tablet Take 25 mg by mouth at bedtime.       Marland Kitchen UNABLE TO FIND Cranial Prothesis  1 each  0   No current facility-administered medications for this visit.   Facility-Administered Medications Ordered in Other  Visits  Medication Dose Route Frequency Provider Last Rate Last Dose  . sodium chloride 0.9 % injection 10 mL  10 mL Intracatheter PRN Victorino December, MD   10 mL at 09/02/12 1619    SURGICAL HISTORY:  Past Surgical History  Procedure Date  . Broken arm   . Broken wrist   . Dilation and curettage of uterus   . Portacath placement 06/29/2012    Procedure: INSERTION PORT-A-CATH;  Surgeon: Adolph Pollack, MD;  Location: [facility];  Service: General;  Laterality: N/A;   ultrasound guided Port-A-Cath insertion    REVIEW OF SYSTEMS:   General: fatigue (-), night sweats (-), fever (-), pain (-) Lymph: palpable nodes (-) HEENT: vision changes (-), mucositis (-), gum bleeding (-), epistaxis (-) Cardiovascular: chest pain (-), palpitations (-) Pulmonary: shortness of breath (-), dyspnea on exertion (-), cough (-), hemoptysis (-) GI:  Early satiety (-), melena (-), dysphagia (-), nausea/vomiting (-), diarrhea (-) GU: dysuria (-), hematuria (-), incontinence (-) Musculoskeletal: joint swelling (-), joint pain (-), back pain (-) Neuro: weakness (+), numbness (-), headache (-), confusion (-) Skin: Rash (-), lesions (-), dryness (-) Psych: depression (-), suicidal/homicidal ideation (-), feeling of hopelessness (-)   PHYSICAL EXAMINATION:  BP 97/64  Pulse 83  Temp 98.4 F (36.9 C) (Oral)  Resp 20  Ht 5\' 3"  (1.6 m)  Wt 130 lb 1.6 oz (59.013 kg)  BMI 23.05 kg/m2  General: Patient is a well appearing female in no acute distress HEENT: PERRLA, sclerae anicteric no conjunctival pallor, MMM Neck: supple, no palpable adenopathy Lungs: clear to auscultation bilaterally, no wheezes, rhonchi, or rales Cardiovascular: regular rate rhythm, S1, S2, no murmurs, rubs or gallops Abdomen: Soft, non-tender, non-distended, normoactive bowel sounds, no HSM Extremities: warm and well perfused, no clubbing, cyanosis, or edema Skin: No rashes or lesions Neuro: Non-focal Right breast:  Minimally palpable mass,  port site looks good ECOG PERFORMANCE STATUS: 0 - Asymptomatic    LABORATORY DATA: Lab Results  Component Value Date   WBC 10.0 09/09/2012   HGB 11.4* 09/09/2012   HCT 34.9 09/09/2012   MCV 95.1 09/09/2012   PLT 165 09/09/2012      Chemistry      Component Value Date/Time   NA 136 09/02/2012 1136   NA 141 06/26/2012 0830   K 4.1 09/02/2012 1136   K 4.3 06/26/2012 0830   CL 107 09/02/2012 1136   CL 105 06/26/2012 0830   CO2 22 09/02/2012 1136   CO2 26 06/26/2012 0830   BUN 16.0 09/02/2012 1136   BUN 12 06/26/2012 0830   CREATININE 0.7 09/02/2012 1136   CREATININE 0.74 06/26/2012 0830      Component Value Date/Time   CALCIUM 9.5 09/02/2012 1136   CALCIUM 10.2 06/26/2012 0830   ALKPHOS 139 09/02/2012 1136   ALKPHOS 99 06/26/2012 0830   AST 61* 09/02/2012 1136   AST 30 06/26/2012 0830   ALT 99* 09/02/2012 1136   ALT 41* 06/26/2012 0830   BILITOT 0.46 09/02/2012 1136   BILITOT 0.4 06/26/2012 0830       RADIOGRAPHIC STUDIES:   ASSESSMENT: 61 year old female with  #1Stage II a (T2 N0) invasive lobular carcinoma of the right breast. Patient is now here for neoadjuvant chemotherapy consisting of Taxotere and Cytoxan.  #2 fatigue due to chemotherapy.  #3 numbness in the soles of her feet    PLAN:  #1 overall patient is doing well. She will return in 2 weeks' time for cycle 5 of Taxotere Cytoxan.  #2 patient will begin Neurontin 200 mg at bedtime for neuropathy.  #3 she will call with any problems.   All questions were answered. The patient knows to call the clinic with any problems, questions or concerns. We can certainly see the patient much sooner if necessary.  I spent 25 minutes counseling the patient face to face. The total time spent in the appointment was 30 minutes.

## 2012-09-23 ENCOUNTER — Ambulatory Visit (HOSPITAL_BASED_OUTPATIENT_CLINIC_OR_DEPARTMENT_OTHER): Payer: BC Managed Care – PPO

## 2012-09-23 ENCOUNTER — Other Ambulatory Visit (HOSPITAL_BASED_OUTPATIENT_CLINIC_OR_DEPARTMENT_OTHER): Payer: BC Managed Care – PPO | Admitting: Lab

## 2012-09-23 ENCOUNTER — Ambulatory Visit (HOSPITAL_BASED_OUTPATIENT_CLINIC_OR_DEPARTMENT_OTHER): Payer: BC Managed Care – PPO | Admitting: Adult Health

## 2012-09-23 ENCOUNTER — Encounter: Payer: Self-pay | Admitting: Adult Health

## 2012-09-23 ENCOUNTER — Telehealth: Payer: Self-pay | Admitting: *Deleted

## 2012-09-23 VITALS — BP 114/65 | HR 73 | Temp 97.8°F | Resp 20 | Ht 63.0 in | Wt 133.5 lb

## 2012-09-23 DIAGNOSIS — C50419 Malignant neoplasm of upper-outer quadrant of unspecified female breast: Secondary | ICD-10-CM

## 2012-09-23 DIAGNOSIS — Z5111 Encounter for antineoplastic chemotherapy: Secondary | ICD-10-CM

## 2012-09-23 DIAGNOSIS — R5381 Other malaise: Secondary | ICD-10-CM

## 2012-09-23 DIAGNOSIS — R5383 Other fatigue: Secondary | ICD-10-CM

## 2012-09-23 LAB — COMPREHENSIVE METABOLIC PANEL (CC13)
Albumin: 3.1 g/dL — ABNORMAL LOW (ref 3.5–5.0)
BUN: 15 mg/dL (ref 7.0–26.0)
Calcium: 9.5 mg/dL (ref 8.4–10.4)
Chloride: 107 mEq/L (ref 98–107)
Glucose: 133 mg/dl — ABNORMAL HIGH (ref 70–99)
Potassium: 4.2 mEq/L (ref 3.5–5.1)
Total Protein: 6 g/dL — ABNORMAL LOW (ref 6.4–8.3)

## 2012-09-23 LAB — CBC WITH DIFFERENTIAL/PLATELET
Basophils Absolute: 0 10*3/uL (ref 0.0–0.1)
Eosinophils Absolute: 0 10*3/uL (ref 0.0–0.5)
HGB: 11.3 g/dL — ABNORMAL LOW (ref 11.6–15.9)
MONO#: 0.8 10*3/uL (ref 0.1–0.9)
NEUT#: 6.9 10*3/uL — ABNORMAL HIGH (ref 1.5–6.5)
RDW: 14.9 % — ABNORMAL HIGH (ref 11.2–14.5)
WBC: 8.2 10*3/uL (ref 3.9–10.3)
lymph#: 0.5 10*3/uL — ABNORMAL LOW (ref 0.9–3.3)

## 2012-09-23 MED ORDER — LORAZEPAM 2 MG/ML IJ SOLN
0.5000 mg | Freq: Once | INTRAMUSCULAR | Status: AC
  Administered 2012-09-23: 0.5 mg via INTRAVENOUS

## 2012-09-23 MED ORDER — SODIUM CHLORIDE 0.9 % IV SOLN
600.0000 mg/m2 | Freq: Once | INTRAVENOUS | Status: AC
  Administered 2012-09-23: 940 mg via INTRAVENOUS
  Filled 2012-09-23: qty 47

## 2012-09-23 MED ORDER — HEPARIN SOD (PORK) LOCK FLUSH 100 UNIT/ML IV SOLN
500.0000 [IU] | Freq: Once | INTRAVENOUS | Status: DC | PRN
  Filled 2012-09-23: qty 5

## 2012-09-23 MED ORDER — SODIUM CHLORIDE 0.9 % IV SOLN
Freq: Once | INTRAVENOUS | Status: AC
  Administered 2012-09-23: 12:00:00 via INTRAVENOUS

## 2012-09-23 MED ORDER — ONDANSETRON 16 MG/50ML IVPB (CHCC)
16.0000 mg | Freq: Once | INTRAVENOUS | Status: AC
  Administered 2012-09-23: 16 mg via INTRAVENOUS

## 2012-09-23 MED ORDER — DOCETAXEL CHEMO INJECTION 160 MG/16ML
75.0000 mg/m2 | Freq: Once | INTRAVENOUS | Status: AC
  Administered 2012-09-23: 120 mg via INTRAVENOUS
  Filled 2012-09-23: qty 12

## 2012-09-23 MED ORDER — SODIUM CHLORIDE 0.9 % IJ SOLN
10.0000 mL | INTRAMUSCULAR | Status: DC | PRN
  Filled 2012-09-23: qty 10

## 2012-09-23 MED ORDER — DEXAMETHASONE SODIUM PHOSPHATE 4 MG/ML IJ SOLN
20.0000 mg | Freq: Once | INTRAMUSCULAR | Status: AC
  Administered 2012-09-23: 20 mg via INTRAVENOUS

## 2012-09-23 NOTE — Patient Instructions (Addendum)
Doing well.  Proceed with chemotherapy.  We will see you back next week for labs.  Please call us if you have any questions or concerns.

## 2012-09-23 NOTE — Progress Notes (Signed)
OFFICE PROGRESS NOTE  CC  [name], MD [address] [place] [address] Dr. Avel Peace Dr. Lurline Hare   DIAGNOSIS: 61 year old female with invasive lobular carcinoma of the right breast.  PRIOR THERAPY:  #1 patient was originally seen in the multidisciplinary breast clinic on a 8/28/ 2013 for a stage II a invasive lobular carcinoma that was ER positive.  #2 at the time it was recommended patient proceed with neoadjuvant chemotherapy. But did want A. Upfront sentinel lymph node biopsy and a Port-A-Cath placed.   #3 she is undergoing treatment with neoadjuvant Taxotere and Cytoxan. A total of 6 cycles is planned.  CURRENT THERAPY: Status post Cycle 5 day 1 of Taxotere and Cytoxan with Neulasta support.   INTERVAL HISTORY: Cassie Carney 61 y.o. female returns for follow up. She's doing well.  Her numbness and tingling are gone.  She was unable to tolerate the Neurontin secondary to anxiety and insomnia, but those have disappeared once stopping the drug last week.  She's feeling well and ready to proceed with her fifth cycle of chemo.  MEDICAL HISTORY: Past Medical History  Diagnosis Date  . Breast cancer   . Mitral valve prolapse   . Night sweats   . Anxiety   . Hot flashes   . PONV (postoperative nausea and vomiting)   . Arthritis     ALLERGIES:  is allergic to adhesive.  MEDICATIONS:  Current Outpatient Prescriptions  Medication Sig Dispense Refill  . ALPRAZolam (XANAX) 0.5 MG tablet Take 0.5 mg by mouth 3 (three) times daily as needed.      . gabapentin (NEURONTIN) 100 MG capsule Take 2 capsules at bedtime daily  60 capsule  3  . metoprolol succinate (TOPROL-XL) 25 MG 24 hr tablet Take 25 mg by mouth at bedtime.       Marland Kitchen UNABLE TO FIND Cranial Prothesis  1 each  0   No current facility-administered medications for this visit.   Facility-Administered Medications Ordered in Other Visits  Medication Dose Route Frequency Provider Last Rate  Last Dose  . sodium chloride 0.9 % injection 10 mL  10 mL Intracatheter PRN Victorino December, MD   10 mL at 09/02/12 1619    SURGICAL HISTORY:  Past Surgical History  Procedure Date  . Broken arm   . Broken wrist   . Dilation and curettage of uterus   . Portacath placement 06/29/2012    Procedure: INSERTION PORT-A-CATH;  Surgeon: Adolph Pollack, MD;  Location: [facility];  Service: General;  Laterality: N/A;   ultrasound guided Port-A-Cath insertion    REVIEW OF SYSTEMS:   General: fatigue (-), night sweats (-), fever (-), pain (-) Lymph: palpable nodes (-) HEENT: vision changes (-), mucositis (-), gum bleeding (-), epistaxis (-) Cardiovascular: chest pain (-), palpitations (-) Pulmonary: shortness of breath (-), dyspnea on exertion (-), cough (-), hemoptysis (-) GI:  Early satiety (-), melena (-), dysphagia (-), nausea/vomiting (-), diarrhea (-) GU: dysuria (-), hematuria (-), incontinence (-) Musculoskeletal: joint swelling (-), joint pain (-), back pain (-) Neuro: weakness (-), numbness (-), headache (-), confusion (-) Skin: Rash (-), lesions (-), dryness (-) Psych: depression (-), suicidal/homicidal ideation (-), feeling of hopelessness (-)   PHYSICAL EXAMINATION:  BP 114/65  Pulse 73  Temp 97.8 F (36.6 C) (Oral)  Resp 20  Ht 5\' 3"  (1.6 m)  Wt 133 lb 8 oz (60.555 kg)  BMI 23.65 kg/m2 General: Patient is a well appearing female  in no acute distress HEENT: PERRLA, sclerae anicteric no conjunctival pallor, MMM Neck: supple, no palpable adenopathy Lungs: clear to auscultation bilaterally, no wheezes, rhonchi, or rales Cardiovascular: regular rate rhythm, S1, S2, no murmurs, rubs or gallops Abdomen: Soft, non-tender, non-distended, normoactive bowel sounds, no HSM Extremities: warm and well perfused, no clubbing, cyanosis, or edema Skin: No rashes or lesions Neuro: Non-focal Right breast: Minimally palpable mass,  port site looks good ECOG PERFORMANCE  STATUS: 0 - Asymptomatic    LABORATORY DATA: Lab Results  Component Value Date   WBC 8.2 09/23/2012   HGB 11.3* 09/23/2012   HCT 33.9* 09/23/2012   MCV 95.0 09/23/2012   PLT 388 09/23/2012      Chemistry      Component Value Date/Time   NA 140 09/09/2012 0959   NA 141 06/26/2012 0830   K 4.3 09/09/2012 0959   K 4.3 06/26/2012 0830   CL 108* 09/09/2012 0959   CL 105 06/26/2012 0830   CO2 26 09/09/2012 0959   CO2 26 06/26/2012 0830   BUN 12.0 09/09/2012 0959   BUN 12 06/26/2012 0830   CREATININE 0.8 09/09/2012 0959   CREATININE 0.74 06/26/2012 0830      Component Value Date/Time   CALCIUM 9.3 09/09/2012 0959   CALCIUM 10.2 06/26/2012 0830   ALKPHOS 164* 09/09/2012 0959   ALKPHOS 99 06/26/2012 0830   AST 40* 09/09/2012 0959   AST 30 06/26/2012 0830   ALT 84* 09/09/2012 0959   ALT 41* 06/26/2012 0830   BILITOT 0.37 09/09/2012 0959   BILITOT 0.4 06/26/2012 0830       RADIOGRAPHIC STUDIES:   ASSESSMENT: 61 year old female with  #1Stage II a (T2 N0) invasive lobular carcinoma of the right breast. Patient is now here for neoadjuvant chemotherapy consisting of Taxotere and Cytoxan.  #2 fatigue due to chemotherapy.  #3 numbness in the soles of her feet    PLAN:  #1 Shes doing well and will proceed with cycle 5 of chemotherapy.    #2 She's stopped her Neurontin.  She doesn't have any numbness right now, we'll keep an eye on this. .  #3 She will return next week for labs.     All questions were answered. The patient knows to call the clinic with any problems, questions or concerns. We can certainly see the patient much sooner if necessary.  I spent 25 minutes counseling the patient face to face. The total time spent in the appointment was 30 minutes.  This case was reviewed with Dr. Welton Flakes.   [name] Lyn Hollingshead, NP Medical Oncology [facility] Phone: [phone]

## 2012-09-23 NOTE — Telephone Encounter (Signed)
As of 09-23-2012 nothing needs to be scheduled

## 2012-09-24 ENCOUNTER — Ambulatory Visit (HOSPITAL_BASED_OUTPATIENT_CLINIC_OR_DEPARTMENT_OTHER): Payer: BC Managed Care – PPO

## 2012-09-24 VITALS — BP 92/57 | HR 77 | Temp 98.7°F

## 2012-09-24 DIAGNOSIS — C50419 Malignant neoplasm of upper-outer quadrant of unspecified female breast: Secondary | ICD-10-CM

## 2012-09-24 DIAGNOSIS — Z5189 Encounter for other specified aftercare: Secondary | ICD-10-CM

## 2012-09-24 MED ORDER — PEGFILGRASTIM INJECTION 6 MG/0.6ML
6.0000 mg | Freq: Once | SUBCUTANEOUS | Status: AC
  Administered 2012-09-24: 6 mg via SUBCUTANEOUS
  Filled 2012-09-24: qty 0.6

## 2012-09-29 ENCOUNTER — Encounter: Payer: Self-pay | Admitting: Medical Oncology

## 2012-09-29 DIAGNOSIS — C50419 Malignant neoplasm of upper-outer quadrant of unspecified female breast: Secondary | ICD-10-CM

## 2012-09-30 ENCOUNTER — Ambulatory Visit (HOSPITAL_BASED_OUTPATIENT_CLINIC_OR_DEPARTMENT_OTHER): Payer: BC Managed Care – PPO | Admitting: Adult Health

## 2012-09-30 ENCOUNTER — Telehealth: Payer: Self-pay | Admitting: Oncology

## 2012-09-30 ENCOUNTER — Encounter: Payer: Self-pay | Admitting: Adult Health

## 2012-09-30 ENCOUNTER — Other Ambulatory Visit (HOSPITAL_BASED_OUTPATIENT_CLINIC_OR_DEPARTMENT_OTHER): Payer: BC Managed Care – PPO | Admitting: Lab

## 2012-09-30 VITALS — BP 103/66 | HR 87 | Temp 98.6°F | Resp 20 | Ht 63.0 in | Wt 132.7 lb

## 2012-09-30 DIAGNOSIS — C50419 Malignant neoplasm of upper-outer quadrant of unspecified female breast: Secondary | ICD-10-CM

## 2012-09-30 DIAGNOSIS — R5381 Other malaise: Secondary | ICD-10-CM

## 2012-09-30 LAB — CBC WITH DIFFERENTIAL/PLATELET
Basophils Absolute: 0.1 10*3/uL (ref 0.0–0.1)
Eosinophils Absolute: 0.4 10*3/uL (ref 0.0–0.5)
HCT: 32 % — ABNORMAL LOW (ref 34.8–46.6)
HGB: 10.9 g/dL — ABNORMAL LOW (ref 11.6–15.9)
MONO#: 2.3 10*3/uL — ABNORMAL HIGH (ref 0.1–0.9)
NEUT#: 8.1 10*3/uL — ABNORMAL HIGH (ref 1.5–6.5)
NEUT%: 70.1 % (ref 38.4–76.8)
RDW: 14.8 % — ABNORMAL HIGH (ref 11.2–14.5)
WBC: 11.6 10*3/uL — ABNORMAL HIGH (ref 3.9–10.3)
lymph#: 0.7 10*3/uL — ABNORMAL LOW (ref 0.9–3.3)

## 2012-09-30 LAB — COMPREHENSIVE METABOLIC PANEL (CC13)
Albumin: 2.9 g/dL — ABNORMAL LOW (ref 3.5–5.0)
BUN: 10 mg/dL (ref 7.0–26.0)
CO2: 24 mEq/L (ref 22–29)
Calcium: 8.8 mg/dL (ref 8.4–10.4)
Chloride: 108 mEq/L — ABNORMAL HIGH (ref 98–107)
Creatinine: 0.7 mg/dL (ref 0.6–1.1)
Potassium: 4.5 mEq/L (ref 3.5–5.1)

## 2012-09-30 MED ORDER — DEXAMETHASONE 4 MG PO TABS: 8.0000 mg | ORAL_TABLET | Freq: Two times a day (BID) | ORAL | Status: DC

## 2012-09-30 NOTE — Telephone Encounter (Signed)
gve the pt her appt with dr Abbey Chatters at [facility] for [facility] 2014

## 2012-09-30 NOTE — Progress Notes (Signed)
OFFICE PROGRESS NOTE  CC  [name], MD [address] [place] [address] Dr. Avel Peace Dr. Lurline Hare   DIAGNOSIS: 60 year old female with invasive lobular carcinoma of the right breast.  PRIOR THERAPY:  #1 patient was originally seen in the multidisciplinary breast clinic on a 8/28/ 2013 for a stage II a invasive lobular carcinoma that was ER positive.  #2 at the time it was recommended patient proceed with neoadjuvant chemotherapy. But did want A. Upfront sentinel lymph node biopsy and a Port-A-Cath placed.   #3 she is undergoing treatment with neoadjuvant Taxotere and Cytoxan. A total of 6 cycles is planned.  CURRENT THERAPY:  Cycle 5 day 8 of Taxotere and Cytoxan with Neulasta support.   INTERVAL HISTORY: Cassie Carney 61 y.o. female returns for follow up.  She tolerated her fifth cycle of Taxotere Cytoxan very well.  Her main complaint is fatigue, but otherwise she's been well with no fevers/chills, and a minimal amount of numbness on her toes, and fingertips that is not interfering with her motor function.  She has her MRI scheduled for 10/19/12.  MEDICAL HISTORY: Past Medical History  Diagnosis Date  . Breast cancer   . Mitral valve prolapse   . Night sweats   . Anxiety   . Hot flashes   . PONV (postoperative nausea and vomiting)   . Arthritis     ALLERGIES:  is allergic to adhesive.  MEDICATIONS:  Current Outpatient Prescriptions  Medication Sig Dispense Refill  . ALPRAZolam (XANAX) 0.5 MG tablet Take 0.5 mg by mouth 3 (three) times daily as needed.      Marland Kitchen dexamethasone (DECADRON) 4 MG tablet Take 2 tablets (8 mg total) by mouth 2 (two) times daily.  16 tablet  0  . gabapentin (NEURONTIN) 100 MG capsule Take 2 capsules at bedtime daily  60 capsule  3  . metoprolol succinate (TOPROL-XL) 25 MG 24 hr tablet Take 25 mg by mouth at bedtime.       . ondansetron (ZOFRAN) 8 MG tablet Take 8 mg by mouth every 8 (eight) hours as needed.       Marland Kitchen UNABLE TO FIND Cranial Prothesis  1 each  0   No current facility-administered medications for this visit.   Facility-Administered Medications Ordered in Other Visits  Medication Dose Route Frequency Provider Last Rate Last Dose  . sodium chloride 0.9 % injection 10 mL  10 mL Intracatheter PRN Victorino December, MD   10 mL at 09/02/12 1619    SURGICAL HISTORY:  Past Surgical History  Procedure Date  . Broken arm   . Broken wrist   . Dilation and curettage of uterus   . Portacath placement 06/29/2012    Procedure: INSERTION PORT-A-CATH;  Surgeon: Adolph Pollack, MD;  Location: [facility];  Service: General;  Laterality: N/A;   ultrasound guided Port-A-Cath insertion    REVIEW OF SYSTEMS:   General: fatigue (+), night sweats (-), fever (-), pain (-) Lymph: palpable nodes (-) HEENT: vision changes (-), mucositis (-), gum bleeding (-), epistaxis (-) Cardiovascular: chest pain (-), palpitations (-) Pulmonary: shortness of breath (-), dyspnea on exertion (-), cough (-), hemoptysis (-) GI:  Early satiety (-), melena (-), dysphagia (-), nausea/vomiting (-), diarrhea (-) GU: dysuria (-), hematuria (-), incontinence (-) Musculoskeletal: joint swelling (-), joint pain (-), back pain (-) Neuro: weakness (-), numbness (-), headache (-), confusion (-) Skin: Rash (-), lesions (-), dryness (-) Psych: depression (-), suicidal/homicidal ideation (-),  feeling of hopelessness (-)   PHYSICAL EXAMINATION:  BP 103/66  Pulse 87  Temp 98.6 F (37 C)  Resp 20  Ht 5\' 3"  (1.6 m)  Wt 132 lb 11.2 oz (60.192 kg)  BMI 23.51 kg/m2 General: Patient is a well appearing female in no acute distress HEENT: PERRLA, sclerae anicteric no conjunctival pallor, MMM Neck: supple, no palpable adenopathy Lungs: clear to auscultation bilaterally, no wheezes, rhonchi, or rales Cardiovascular: regular rate rhythm, S1, S2, no murmurs, rubs or gallops Abdomen: Soft, non-tender, non-distended, normoactive  bowel sounds, no HSM Extremities: warm and well perfused, no clubbing, cyanosis, or edema Skin: No rashes or lesions Neuro: Non-focal Right breast: Minimally palpable mass,  port site looks good ECOG PERFORMANCE STATUS: 0 - Asymptomatic    LABORATORY DATA: Lab Results  Component Value Date   WBC 11.6* 09/30/2012   HGB 10.9* 09/30/2012   HCT 32.0* 09/30/2012   MCV 94.2 09/30/2012   PLT 186 09/30/2012      Chemistry      Component Value Date/Time   NA 141 09/23/2012 1029   NA 141 06/26/2012 0830   K 4.2 09/23/2012 1029   K 4.3 06/26/2012 0830   CL 107 09/23/2012 1029   CL 105 06/26/2012 0830   CO2 22 09/23/2012 1029   CO2 26 06/26/2012 0830   BUN 15.0 09/23/2012 1029   BUN 12 06/26/2012 0830   CREATININE 0.7 09/23/2012 1029   CREATININE 0.74 06/26/2012 0830      Component Value Date/Time   CALCIUM 9.5 09/23/2012 1029   CALCIUM 10.2 06/26/2012 0830   ALKPHOS 113 09/23/2012 1029   ALKPHOS 99 06/26/2012 0830   AST 22 09/23/2012 1029   AST 30 06/26/2012 0830   ALT 34 09/23/2012 1029   ALT 41* 06/26/2012 0830   BILITOT 0.36 09/23/2012 1029   BILITOT 0.4 06/26/2012 0830       RADIOGRAPHIC STUDIES:   ASSESSMENT: 61 year old female with  #1Stage II a (T2 N0) invasive lobular carcinoma of the right breast. Patient is now here for neoadjuvant chemotherapy consisting of Taxotere and Cytoxan.  #2 fatigue due to chemotherapy.  #3 numbness in the soles of her feet and fingertips    PLAN:  #1 Shes doing well after her fifth cycle of chemotherapy.  She has her MRI to evaluate her response to treatment on 12/30, we will set her up with Dr. Abbey Chatters after this test.    #2 She's stopped her Neurontin.  Her numbness is very minimal, we will continue to monitor.   #3 She will return on 12/26 for her last cycle of Taxotere Cytoxan, have her MRI on 12/30 and her lab appt on 10/22/12.  I refilled her Dexamethasone today for her final cycle of chemo   All questions were answered. The patient knows to call  the clinic with any problems, questions or concerns. We can certainly see the patient much sooner if necessary.  I spent 25 minutes counseling the patient face to face. The total time spent in the appointment was 30 minutes.  This case was reviewed with Dr. Welton Flakes.   [name] Lyn Hollingshead, NP Medical Oncology [facility] Phone: [phone]

## 2012-09-30 NOTE — Patient Instructions (Addendum)
Doing well. Labs look good.  We will see you back on 12/26 for your last cycle of chemotherapy.  Please call us if you have any questions or concerns.

## 2012-10-13 ENCOUNTER — Other Ambulatory Visit: Payer: Self-pay | Admitting: *Deleted

## 2012-10-13 DIAGNOSIS — C50419 Malignant neoplasm of upper-outer quadrant of unspecified female breast: Secondary | ICD-10-CM

## 2012-10-15 ENCOUNTER — Other Ambulatory Visit (HOSPITAL_BASED_OUTPATIENT_CLINIC_OR_DEPARTMENT_OTHER): Payer: BC Managed Care – PPO | Admitting: Lab

## 2012-10-15 ENCOUNTER — Ambulatory Visit (HOSPITAL_BASED_OUTPATIENT_CLINIC_OR_DEPARTMENT_OTHER): Payer: BC Managed Care – PPO | Admitting: Adult Health

## 2012-10-15 ENCOUNTER — Ambulatory Visit (HOSPITAL_BASED_OUTPATIENT_CLINIC_OR_DEPARTMENT_OTHER): Payer: BC Managed Care – PPO

## 2012-10-15 ENCOUNTER — Encounter: Payer: Self-pay | Admitting: Adult Health

## 2012-10-15 VITALS — BP 109/70 | HR 73 | Temp 97.9°F | Resp 20 | Ht 63.0 in | Wt 134.4 lb

## 2012-10-15 DIAGNOSIS — R209 Unspecified disturbances of skin sensation: Secondary | ICD-10-CM

## 2012-10-15 DIAGNOSIS — Z170 Estrogen receptor positive status [ER+]: Secondary | ICD-10-CM

## 2012-10-15 DIAGNOSIS — C50419 Malignant neoplasm of upper-outer quadrant of unspecified female breast: Secondary | ICD-10-CM

## 2012-10-15 DIAGNOSIS — Z5111 Encounter for antineoplastic chemotherapy: Secondary | ICD-10-CM

## 2012-10-15 LAB — CBC WITH DIFFERENTIAL/PLATELET
Eosinophils Absolute: 0 10*3/uL (ref 0.0–0.5)
MCV: 94.6 fL (ref 79.5–101.0)
MONO#: 0.6 10*3/uL (ref 0.1–0.9)
MONO%: 8.4 % (ref 0.0–14.0)
NEUT#: 5.4 10*3/uL (ref 1.5–6.5)
RBC: 3.44 10*6/uL — ABNORMAL LOW (ref 3.70–5.45)
RDW: 15 % — ABNORMAL HIGH (ref 11.2–14.5)
WBC: 6.7 10*3/uL (ref 3.9–10.3)

## 2012-10-15 LAB — COMPREHENSIVE METABOLIC PANEL (CC13)
Albumin: 3 g/dL — ABNORMAL LOW (ref 3.5–5.0)
Alkaline Phosphatase: 100 U/L (ref 40–150)
Glucose: 133 mg/dl — ABNORMAL HIGH (ref 70–99)
Potassium: 4.4 mEq/L (ref 3.5–5.1)
Sodium: 139 mEq/L (ref 136–145)
Total Protein: 5.6 g/dL — ABNORMAL LOW (ref 6.4–8.3)

## 2012-10-15 MED ORDER — DEXAMETHASONE SODIUM PHOSPHATE 4 MG/ML IJ SOLN
20.0000 mg | Freq: Once | INTRAMUSCULAR | Status: AC
  Administered 2012-10-15: 20 mg via INTRAVENOUS

## 2012-10-15 MED ORDER — DOCETAXEL CHEMO INJECTION 160 MG/16ML
75.0000 mg/m2 | Freq: Once | INTRAVENOUS | Status: AC
  Administered 2012-10-15: 120 mg via INTRAVENOUS
  Filled 2012-10-15: qty 12

## 2012-10-15 MED ORDER — SODIUM CHLORIDE 0.9 % IJ SOLN
10.0000 mL | INTRAMUSCULAR | Status: DC | PRN
  Administered 2012-10-15: 10 mL
  Filled 2012-10-15: qty 10

## 2012-10-15 MED ORDER — HEPARIN SOD (PORK) LOCK FLUSH 100 UNIT/ML IV SOLN
500.0000 [IU] | Freq: Once | INTRAVENOUS | Status: AC | PRN
  Administered 2012-10-15: 500 [IU]
  Filled 2012-10-15: qty 5

## 2012-10-15 MED ORDER — ONDANSETRON 16 MG/50ML IVPB (CHCC)
16.0000 mg | Freq: Once | INTRAVENOUS | Status: AC
  Administered 2012-10-15: 16 mg via INTRAVENOUS

## 2012-10-15 MED ORDER — LORAZEPAM 2 MG/ML IJ SOLN
0.5000 mg | Freq: Once | INTRAMUSCULAR | Status: AC
  Administered 2012-10-15: 0.5 mg via INTRAVENOUS

## 2012-10-15 MED ORDER — SODIUM CHLORIDE 0.9 % IV SOLN
Freq: Once | INTRAVENOUS | Status: AC
  Administered 2012-10-15: 11:00:00 via INTRAVENOUS

## 2012-10-15 MED ORDER — SODIUM CHLORIDE 0.9 % IV SOLN
600.0000 mg/m2 | Freq: Once | INTRAVENOUS | Status: AC
  Administered 2012-10-15: 940 mg via INTRAVENOUS
  Filled 2012-10-15: qty 47

## 2012-10-15 NOTE — Patient Instructions (Addendum)
Doing well.  Proceed with chemotherapy.  We will see you back in one week.  Please call us if you have any questions or concerns.

## 2012-10-15 NOTE — Progress Notes (Signed)
OFFICE PROGRESS NOTE  CC  [name], MD [address] [place] [address] Dr. Avel Peace Dr. Lurline Hare   DIAGNOSIS: 61 year old female with invasive lobular carcinoma of the right breast.  PRIOR THERAPY:  #1 patient was originally seen in the multidisciplinary breast clinic on a 8/28/ 2013 for a stage II a invasive lobular carcinoma that was ER positive.  #2 at the time it was recommended patient proceed with neoadjuvant chemotherapy. But did want A. Upfront sentinel lymph node biopsy and a Port-A-Cath placed.   #3 she is undergoing treatment with neoadjuvant Taxotere and Cytoxan. A total of 6 cycles is planned.  CURRENT THERAPY:  Cycle 6 day 1 of Taxotere and Cytoxan with Neulasta support.    INTERVAL HISTORY: Cassie Carney 62 y.o. female returns for follow up. She continues to be fatigued, but otherwise has no other questions or concerns.  Her numbness in her fingertips and toes is unchanged, there are no motor changes.  This is her final cycle of Taxotere/Cytoxan.  She will have her MRI on 10/19/12, see [place] back in clinic on 10/22/12, and see Dr. Abbey Chatters on 10/30/12.   MEDICAL HISTORY: Past Medical History  Diagnosis Date  . Breast cancer   . Mitral valve prolapse   . Night sweats   . Anxiety   . Hot flashes   . PONV (postoperative nausea and vomiting)   . Arthritis     ALLERGIES:  is allergic to adhesive.  MEDICATIONS:  Current Outpatient Prescriptions  Medication Sig Dispense Refill  . ALPRAZolam (XANAX) 0.5 MG tablet Take 0.5 mg by mouth 3 (three) times daily as needed.      Marland Kitchen dexamethasone (DECADRON) 4 MG tablet Take 2 tablets (8 mg total) by mouth 2 (two) times daily.  16 tablet  0  . metoprolol succinate (TOPROL-XL) 25 MG 24 hr tablet Take 25 mg by mouth at bedtime.       Marland Kitchen UNABLE TO FIND Cranial Prothesis  1 each  0  . gabapentin (NEURONTIN) 100 MG capsule Take 2 capsules at bedtime daily  60 capsule  3  . ondansetron (ZOFRAN) 8  MG tablet Take 8 mg by mouth every 8 (eight) hours as needed.       No current facility-administered medications for this visit.   Facility-Administered Medications Ordered in Other Visits  Medication Dose Route Frequency Provider Last Rate Last Dose  . sodium chloride 0.9 % injection 10 mL  10 mL Intracatheter PRN Victorino December, MD   10 mL at 09/02/12 1619    SURGICAL HISTORY:  Past Surgical History  Procedure Date  . Broken arm   . Broken wrist   . Dilation and curettage of uterus   . Portacath placement 06/29/2012    Procedure: INSERTION PORT-A-CATH;  Surgeon: Adolph Pollack, MD;  Location: [facility];  Service: General;  Laterality: N/A;   ultrasound guided Port-A-Cath insertion    REVIEW OF SYSTEMS:   General: fatigue (+), night sweats (-), fever (-), pain (-) Lymph: palpable nodes (-) HEENT: vision changes (-), mucositis (-), gum bleeding (-), epistaxis (-) Cardiovascular: chest pain (-), palpitations (-) Pulmonary: shortness of breath (-), dyspnea on exertion (-), cough (-), hemoptysis (-) GI:  Early satiety (-), melena (-), dysphagia (-), nausea/vomiting (-), diarrhea (-) GU: dysuria (-), hematuria (-), incontinence (-) Musculoskeletal: joint swelling (-), joint pain (-), back pain (-) Neuro: weakness (-), numbness (-), headache (-), confusion (-) Skin: Rash (-), lesions (-),  dryness (-) Psych: depression (-), suicidal/homicidal ideation (-), feeling of hopelessness (-)   PHYSICAL EXAMINATION:  BP 109/70  Pulse 73  Temp 97.9 F (36.6 C) (Oral)  Resp 20  Ht 5\' 3"  (1.6 m)  Wt 134 lb 6.4 oz (60.963 kg)  BMI 23.81 kg/m2 General: Patient is a well appearing female in no acute distress HEENT: PERRLA, sclerae anicteric no conjunctival pallor, MMM Neck: supple, no palpable adenopathy Lungs: clear to auscultation bilaterally, no wheezes, rhonchi, or rales Cardiovascular: regular rate rhythm, S1, S2, no murmurs, rubs or gallops Abdomen: Soft, non-tender,  non-distended, normoactive bowel sounds, no HSM Extremities: warm and well perfused, no clubbing, cyanosis, or edema Skin: No rashes or lesions Neuro: Non-focal Right breast: Minimally palpable mass,  port site looks good ECOG PERFORMANCE STATUS: 0 - Asymptomatic    LABORATORY DATA: Lab Results  Component Value Date   WBC 6.7 10/15/2012   HGB 11.1* 10/15/2012   HCT 32.6* 10/15/2012   MCV 94.6 10/15/2012   PLT 319 10/15/2012      Chemistry      Component Value Date/Time   NA 139 10/15/2012 0934   NA 141 06/26/2012 0830   K 4.4 10/15/2012 0934   K 4.3 06/26/2012 0830   CL 108* 10/15/2012 0934   CL 105 06/26/2012 0830   CO2 22 10/15/2012 0934   CO2 26 06/26/2012 0830   BUN 14.0 10/15/2012 0934   BUN 12 06/26/2012 0830   CREATININE 0.7 10/15/2012 0934   CREATININE 0.74 06/26/2012 0830      Component Value Date/Time   CALCIUM 9.3 10/15/2012 0934   CALCIUM 10.2 06/26/2012 0830   ALKPHOS 100 10/15/2012 0934   ALKPHOS 99 06/26/2012 0830   AST 17 10/15/2012 0934   AST 30 06/26/2012 0830   ALT 20 10/15/2012 0934   ALT 41* 06/26/2012 0830   BILITOT 0.34 10/15/2012 0934   BILITOT 0.4 06/26/2012 0830       RADIOGRAPHIC STUDIES:   ASSESSMENT: 61 year old female with  #1Stage II a (T2 N0) invasive lobular carcinoma of the right breast. Patient is now here for neoadjuvant chemotherapy consisting of Taxotere and Cytoxan.  #2 fatigue due to chemotherapy.  #3 numbness in the soles of her feet and fingertips    PLAN:  #1 Doing well proceed with chemotherapy.    #2 She's stopped her Neurontin.  Her numbness is very minimal, we will continue to monitor.   #3 She will return on 10/22/12 for labs and the rest of appts as noted in interval history.     All questions were answered. The patient knows to call the clinic with any problems, questions or concerns. We can certainly see the patient much sooner if necessary.  I spent 15 minutes counseling the patient face to face. The total time spent  in the appointment was 30 minutes.  This case was reviewed with Dr. Welton Flakes.   [name] Lyn Hollingshead, NP Medical Oncology [facility] Phone: [phone]

## 2012-10-15 NOTE — Patient Instructions (Addendum)
[facility] Discharge Instructions for Patients Receiving Chemotherapy  Today you received the following chemotherapy agents :  Taxotere,  Cytoxan.  To help prevent nausea and vomiting after your treatment, we encourage you to take your nausea medication as instructed by your physician. .   If you develop nausea and vomiting that is not controlled by your nausea medication, call the clinic. If it is after clinic hours your family physician or the after hours number for the clinic or go to the Emergency Department.   BELOW ARE SYMPTOMS THAT SHOULD BE REPORTED IMMEDIATELY:  *FEVER GREATER THAN 100.5 F  *CHILLS WITH OR WITHOUT FEVER  NAUSEA AND VOMITING THAT IS NOT CONTROLLED WITH YOUR NAUSEA MEDICATION  *UNUSUAL SHORTNESS OF BREATH  *UNUSUAL BRUISING OR BLEEDING  TENDERNESS IN MOUTH AND THROAT WITH OR WITHOUT PRESENCE OF ULCERS  *URINARY PROBLEMS  *BOWEL PROBLEMS  UNUSUAL RASH Items with * indicate a potential emergency and should be followed up as soon as possible.  One of the nurses will contact you 24 hours after your treatment. Please let the nurse know about any problems that you may have experienced. Feel free to call the clinic you have any questions or concerns. The clinic phone number is [phone].   I have been informed and understand all the instructions given to me. I know to contact the clinic, my physician, or go to the Emergency Department if any problems should occur. I do not have any questions at this time, but understand that I may call the clinic during office hours   should I have any questions or need assistance in obtaining follow up care.    __________________________________________  _____________  __________ Signature of Patient or Authorized Representative            Date                   Time    __________________________________________ Nurse's Signature

## 2012-10-16 ENCOUNTER — Ambulatory Visit (HOSPITAL_BASED_OUTPATIENT_CLINIC_OR_DEPARTMENT_OTHER): Payer: BC Managed Care – PPO

## 2012-10-16 VITALS — BP 93/55 | HR 73 | Temp 96.4°F | Resp 20

## 2012-10-16 DIAGNOSIS — Z5189 Encounter for other specified aftercare: Secondary | ICD-10-CM

## 2012-10-16 DIAGNOSIS — C50419 Malignant neoplasm of upper-outer quadrant of unspecified female breast: Secondary | ICD-10-CM

## 2012-10-16 MED ORDER — PEGFILGRASTIM INJECTION 6 MG/0.6ML
6.0000 mg | Freq: Once | SUBCUTANEOUS | Status: AC
  Administered 2012-10-16: 6 mg via SUBCUTANEOUS
  Filled 2012-10-16: qty 0.6

## 2012-10-19 ENCOUNTER — Other Ambulatory Visit: Payer: Self-pay | Admitting: Certified Registered Nurse Anesthetist

## 2012-10-19 ENCOUNTER — Other Ambulatory Visit: Payer: Self-pay | Admitting: Adult Health

## 2012-10-19 ENCOUNTER — Ambulatory Visit (HOSPITAL_COMMUNITY)
Admission: RE | Admit: 2012-10-19 | Discharge: 2012-10-19 | Disposition: A | Discharge status: Home / Self Care | Payer: BC Managed Care – PPO | Source: Ambulatory Visit | Attending: Adult Health | Admitting: Adult Health

## 2012-10-19 DIAGNOSIS — C50419 Malignant neoplasm of upper-outer quadrant of unspecified female breast: Secondary | ICD-10-CM

## 2012-10-19 DIAGNOSIS — Z9221 Personal history of antineoplastic chemotherapy: Secondary | ICD-10-CM | POA: Insufficient documentation

## 2012-10-19 MED ORDER — GADOBENATE DIMEGLUMINE 529 MG/ML IV SOLN
12.0000 mL | Freq: Once | INTRAVENOUS | Status: AC | PRN
  Administered 2012-10-19: 12 mL via INTRAVENOUS

## 2012-10-20 ENCOUNTER — Other Ambulatory Visit: Payer: Self-pay | Admitting: Medical Oncology

## 2012-10-20 DIAGNOSIS — C50419 Malignant neoplasm of upper-outer quadrant of unspecified female breast: Secondary | ICD-10-CM

## 2012-10-22 ENCOUNTER — Telehealth: Payer: Self-pay | Admitting: *Deleted

## 2012-10-22 ENCOUNTER — Ambulatory Visit (HOSPITAL_BASED_OUTPATIENT_CLINIC_OR_DEPARTMENT_OTHER): Payer: BC Managed Care – PPO | Admitting: Adult Health

## 2012-10-22 ENCOUNTER — Encounter: Payer: Self-pay | Admitting: Adult Health

## 2012-10-22 ENCOUNTER — Other Ambulatory Visit (HOSPITAL_BASED_OUTPATIENT_CLINIC_OR_DEPARTMENT_OTHER): Payer: BC Managed Care – PPO | Admitting: Lab

## 2012-10-22 VITALS — BP 109/66 | HR 73 | Temp 98.0°F | Resp 20 | Ht 63.0 in | Wt 132.1 lb

## 2012-10-22 DIAGNOSIS — R209 Unspecified disturbances of skin sensation: Secondary | ICD-10-CM

## 2012-10-22 DIAGNOSIS — C50419 Malignant neoplasm of upper-outer quadrant of unspecified female breast: Secondary | ICD-10-CM

## 2012-10-22 DIAGNOSIS — R5381 Other malaise: Secondary | ICD-10-CM

## 2012-10-22 LAB — CBC WITH DIFFERENTIAL/PLATELET
BASO%: 0.6 % (ref 0.0–2.0)
Basophils Absolute: 0.1 10*3/uL (ref 0.0–0.1)
EOS%: 2.5 % (ref 0.0–7.0)
HCT: 31.3 % — ABNORMAL LOW (ref 34.8–46.6)
LYMPH%: 6.8 % — ABNORMAL LOW (ref 14.0–49.7)
MCH: 32.7 pg (ref 25.1–34.0)
MCHC: 34.5 g/dL (ref 31.5–36.0)
MCV: 94.8 fL (ref 79.5–101.0)
MONO%: 23.1 % — ABNORMAL HIGH (ref 0.0–14.0)
NEUT%: 67 % (ref 38.4–76.8)
Platelets: 159 10*3/uL (ref 145–400)
lymph#: 0.6 10*3/uL — ABNORMAL LOW (ref 0.9–3.3)

## 2012-10-22 NOTE — Telephone Encounter (Signed)
Gave patient appointment for 11-02-2012 starting at 1:15pm

## 2012-10-22 NOTE — Progress Notes (Signed)
OFFICE PROGRESS NOTE  CC  [name], MD [address] [place] [address] Dr. Avel Peace Dr. Lurline Hare   DIAGNOSIS: 62 year old female with invasive lobular carcinoma of the right breast.  PRIOR THERAPY:  #1 patient was originally seen in the multidisciplinary breast clinic on a 8/28/ 2013 for a stage II a invasive lobular carcinoma that was ER positive.  #2 at the time it was recommended patient proceed with neoadjuvant chemotherapy. But did want A. Upfront sentinel lymph node biopsy and a Port-A-Cath placed.   #3 she is undergoing treatment with neoadjuvant Taxotere and Cytoxan. A total of 6 cycles is planned.  CURRENT THERAPY:  Cycle 6 day 8 of Taxotere and Cytoxan with Neulasta support.    INTERVAL HISTORY: Cassie Carney 62 y.o. female returns for follow up. She continues to be fatigued, but otherwise has no other questions or concerns.  Her numbness in her fingertips and toes is slightly improved.  Her MRI showed complete resolution of the previously enhancing breast nodule.  She has an appt. With Dr. Abbey Chatters on 10/30/12.   MEDICAL HISTORY: Past Medical History  Diagnosis Date  . Breast cancer   . Mitral valve prolapse   . Night sweats   . Anxiety   . Hot flashes   . PONV (postoperative nausea and vomiting)   . Arthritis     ALLERGIES:  is allergic to adhesive.  MEDICATIONS:  Current Outpatient Prescriptions  Medication Sig Dispense Refill  . metoprolol succinate (TOPROL-XL) 25 MG 24 hr tablet Take 25 mg by mouth at bedtime.       Marland Kitchen UNABLE TO FIND Cranial Prothesis  1 each  0  . ALPRAZolam (XANAX) 0.5 MG tablet Take 0.5 mg by mouth 3 (three) times daily as needed.      Marland Kitchen dexamethasone (DECADRON) 4 MG tablet Take 2 tablets (8 mg total) by mouth 2 (two) times daily.  16 tablet  0  . gabapentin (NEURONTIN) 100 MG capsule Take 2 capsules at bedtime daily  60 capsule  3  . ondansetron (ZOFRAN) 8 MG tablet Take 8 mg by mouth every 8  (eight) hours as needed.       No current facility-administered medications for this visit.   Facility-Administered Medications Ordered in Other Visits  Medication Dose Route Frequency Provider Last Rate Last Dose  . sodium chloride 0.9 % injection 10 mL  10 mL Intracatheter PRN Victorino December, MD   10 mL at 09/02/12 1619    SURGICAL HISTORY:  Past Surgical History  Procedure Date  . Broken arm   . Broken wrist   . Dilation and curettage of uterus   . Portacath placement 06/29/2012    Procedure: INSERTION PORT-A-CATH;  Surgeon: Adolph Pollack, MD;  Location: [facility];  Service: General;  Laterality: N/A;   ultrasound guided Port-A-Cath insertion    REVIEW OF SYSTEMS:   General: fatigue (+), night sweats (-), fever (-), pain (-) Lymph: palpable nodes (-) HEENT: vision changes (-), mucositis (-), gum bleeding (-), epistaxis (-) Cardiovascular: chest pain (-), palpitations (-) Pulmonary: shortness of breath (-), dyspnea on exertion (-), cough (-), hemoptysis (-) GI:  Early satiety (-), melena (-), dysphagia (-), nausea/vomiting (-), diarrhea (-) GU: dysuria (-), hematuria (-), incontinence (-) Musculoskeletal: joint swelling (-), joint pain (-), back pain (-) Neuro: weakness (-), numbness (-), headache (-), confusion (-) Skin: Rash (-), lesions (-), dryness (-) Psych: depression (-), suicidal/homicidal ideation (-), feeling of hopelessness (-)  PHYSICAL EXAMINATION:  BP 109/66  Pulse 73  Temp 98 F (36.7 C)  Resp 20  Ht 5\' 3"  (1.6 m)  Wt 132 lb 1.6 oz (59.92 kg)  BMI 23.40 kg/m2 General: Patient is a well appearing female in no acute distress HEENT: PERRLA, sclerae anicteric no conjunctival pallor, MMM Neck: supple, no palpable adenopathy Lungs: clear to auscultation bilaterally, no wheezes, rhonchi, or rales Cardiovascular: regular rate rhythm, S1, S2, no murmurs, rubs or gallops Abdomen: Soft, non-tender, non-distended, normoactive bowel sounds, no  HSM Extremities: warm and well perfused, no clubbing, cyanosis, or edema Skin: No rashes or lesions Neuro: Non-focal Right breast: Minimally palpable mass,  port site looks good ECOG PERFORMANCE STATUS: 0 - Asymptomatic    LABORATORY DATA: Lab Results  Component Value Date   WBC 8.5 10/22/2012   HGB 10.8* 10/22/2012   HCT 31.3* 10/22/2012   MCV 94.8 10/22/2012   PLT 159 10/22/2012      Chemistry      Component Value Date/Time   NA 139 10/15/2012 0934   NA 141 06/26/2012 0830   K 4.4 10/15/2012 0934   K 4.3 06/26/2012 0830   CL 108* 10/15/2012 0934   CL 105 06/26/2012 0830   CO2 22 10/15/2012 0934   CO2 26 06/26/2012 0830   BUN 14.0 10/15/2012 0934   BUN 12 06/26/2012 0830   CREATININE 0.7 10/15/2012 0934   CREATININE 0.74 06/26/2012 0830      Component Value Date/Time   CALCIUM 9.3 10/15/2012 0934   CALCIUM 10.2 06/26/2012 0830   ALKPHOS 100 10/15/2012 0934   ALKPHOS 99 06/26/2012 0830   AST 17 10/15/2012 0934   AST 30 06/26/2012 0830   ALT 20 10/15/2012 0934   ALT 41* 06/26/2012 0830   BILITOT 0.34 10/15/2012 0934   BILITOT 0.4 06/26/2012 0830       RADIOGRAPHIC STUDIES:   ASSESSMENT: 62 year old female with  #1Stage II a (T2 N0) invasive lobular carcinoma of the right breast. Patient is now here for neoadjuvant chemotherapy consisting of Taxotere and Cytoxan.  #2 fatigue due to chemotherapy.  #3 numbness in the soles of her feet and fingertips    PLAN:  #1 Doing well.  Her MRI results are fantastic.  She will see Dr. Abbey Chatters on 1/10 to discuss her upcoming surgery.    #2 She's stopped her Neurontin.  Her numbness is very minimal, we will continue to monitor.   #3 She will return the week of 1/13 for a labs and one last f/u appt before surgery.     All questions were answered. The patient knows to call the clinic with any problems, questions or concerns. We can certainly see the patient much sooner if necessary.  I spent 25 minutes counseling the patient face to face. The  total time spent in the appointment was 30 minutes.  This case was reviewed with Dr. Welton Flakes.   [name] Lyn Hollingshead, NP Medical Oncology [facility] Phone: [phone]

## 2012-10-22 NOTE — Patient Instructions (Signed)
Doing well.  Labs look good.  MRI shows resolution of mass.  We will see you after your appt with Dr. Abbey Chatters the week of 1/13.  Please call us if you have any questions or concerns.

## 2012-10-30 ENCOUNTER — Ambulatory Visit (INDEPENDENT_AMBULATORY_CARE_PROVIDER_SITE_OTHER): Payer: BC Managed Care – PPO | Admitting: General Surgery

## 2012-10-30 ENCOUNTER — Encounter (INDEPENDENT_AMBULATORY_CARE_PROVIDER_SITE_OTHER): Payer: Self-pay | Admitting: General Surgery

## 2012-10-30 VITALS — BP 124/66 | HR 72 | Temp 97.8°F | Resp 14 | Ht 63.0 in | Wt 135.6 lb

## 2012-10-30 DIAGNOSIS — Z853 Personal history of malignant neoplasm of breast: Secondary | ICD-10-CM

## 2012-10-30 NOTE — Patient Instructions (Signed)
[facility] [phone]  MASTECTOMY: POST OP INSTRUCTIONS  Always review your discharge instruction sheet given to you by the facility where your surgery was performed. IF YOU HAVE DISABILITY OR FAMILY LEAVE FORMS, YOU MUST BRING THEM TO THE OFFICE FOR PROCESSING.   DO NOT GIVE THEM TO YOUR DOCTOR. A prescription for pain medication may be given to you upon discharge.  Take your pain medication as prescribed, if needed.  If narcotic pain medicine is not needed, then you may take acetaminophen (Tylenol) or ibuprofen (Advil) as needed. 1. Take your usually prescribed medications unless otherwise directed. 2. If you need a refill on your pain medication, please contact your pharmacy.  They will contact our office to request authorization.  Prescriptions will not be filled after 5pm or on week-ends. 3. You should follow a light diet the first few days after arrival home, such as soup and crackers, etc.  Resume your normal diet the day after surgery. 4. Most patients will experience some swelling and bruising on the chest and underarm.  Ice packs will help.  Swelling and bruising can take several days to resolve.  5. It is common to experience some constipation if taking pain medication after surgery.  Increasing fluid intake and taking a stool softener (such as Colace) will usually help or prevent this problem from occurring.  A mild laxative (Milk of Magnesia or Miralax) should be taken according to package instructions if there are no bowel movements after 48 hours. 6. Unless discharge instructions indicate otherwise, leave your bandage dry and in place until your next appointment in 3-5 days.  You may take a limited sponge bath.  No tube baths or showers until the drains are removed.  You may have steri-strips (small skin tapes) in place directly over the incision.  These strips should be left on the skin for 7-10 days.  If your surgeon used skin glue on the incision, you may  shower in 24 hours.  The glue will flake off over the next 2-3 weeks.  Any sutures or staples will be removed at the office during your follow-up visit. 7. DRAINS:  If you have drains in place, it is important to keep a list of the amount of drainage produced each day in your drains.  Before leaving the hospital, you should be instructed on drain care.  Call our office if you have any questions about your drains. 8. ACTIVITIES:  You may resume regular (light) daily activities beginning the next day-such as daily self-care, walking, climbing stairs-gradually increasing activities as tolerated.  You may have sexual intercourse when it is comfortable.  Refrain from any heavy lifting or straining until approved by your doctor. a. You may drive when you are no longer taking prescription pain medication, you can comfortably wear a seatbelt, and you can safely maneuver your car and apply brakes. b. RETURN TO WORK:  __3-6 weeks________________________________________________________ 9. You should see your doctor in the office for a follow-up appointment approximately 3-5 days after your surgery.  Your doctor's nurse will typically make your follow-up appointment when she calls you with your pathology report.  Expect your pathology report 2-3 business days after your surgery.  You may call to check if you do not hear from [place] after three days.   10. OTHER INSTRUCTIONS: ______________________________________________________________________________________________ ____________________________________________________________________________________________ WHEN TO CALL YOUR DOCTOR: 1. Fever over 101.0 2. Nausea and/or vomiting 3. Extreme swelling or bruising 4. Continued bleeding from incision. 5. Increased pain, redness, or drainage from the  incision. The clinic staff is available to answer your questions during regular business hours.  Please don't hesitate to call and ask to speak to one of the nurses for  clinical concerns.  If you have a medical emergency, go to the nearest emergency room or call 911.  A surgeon from [facility] is always on call at the hospital. [address], [address], [place], [place]  [address] ? [address] [address], [place], [place]   [address] [phone] ? [phone] ? FAX [phone] Web site: [email or web address]

## 2012-10-30 NOTE — Progress Notes (Signed)
Patient ID: Genavieve L Bonito, female   DOB: 07/19/1951, 62 y.o.   MRN: [id number]  Chief Complaint  Patient presents with  . Routine Post Op    reck breast    HPI Haizley L Violante is a 62 y.o. female.   HPI  She noticed a mass in her breast the summer of 2013. She noticed a slight bit of skin dimpling in the area which was the upper outer quadrant. She subsequently underwent imaging studies. An ultrasound-guided biopsy of the mass was performed which demonstrated invasive lobular carcinoma. On ultrasound, the mass was 1.7 cm in size. Postbiopsy MRI demonstrated a 3.8 cm mass was close to the pectoralis muscle. The tumor is estrogen receptor and progesterone receptor positive, her 2 negative, proliferation rate is 18%.  She underwent Port-A-Cath placement and right axillary sentinel lymph node biopsy were both lymph nodes were positive for tumor. She subsequently had neoadjuvant chemotherapy and based on recent MRI the lesion is completely resolved. She is here to discuss definitive surgery.  She is here with her husband.   Past Medical History  Diagnosis Date  . Breast cancer   . Mitral valve prolapse   . Night sweats   . Anxiety   . Hot flashes   . PONV (postoperative nausea and vomiting)   . Arthritis     Past Surgical History  Procedure Date  . Broken arm   . Broken wrist   . Dilation and curettage of uterus   . Portacath placement 06/29/2012    Procedure: INSERTION PORT-A-CATH;  Surgeon: Tymere Depuy J Kenae Lindquist, MD;  Location: [facility];  Service: General;  Laterality: N/A;   ultrasound guided Port-A-Cath insertion    Family History  Problem Relation Age of Onset  . Cancer Father     squamous cell carcinoma    Social History History  Substance Use Topics  . Smoking status: Never Smoker   . Smokeless tobacco: Never Used  . Alcohol Use: 4.2 oz/week    7 Glasses of wine per week    Allergies  Allergen Reactions  . Adhesive (Tape)     Rash    Current Outpatient  Prescriptions  Medication Sig Dispense Refill  . ALPRAZolam (XANAX) 0.5 MG tablet Take 0.5 mg by mouth 3 (three) times daily as needed.      . metoprolol succinate (TOPROL-XL) 25 MG 24 hr tablet Take 25 mg by mouth at bedtime.       . UNABLE TO FIND Cranial Prothesis  1 each  0   No current facility-administered medications for this visit.   Facility-Administered Medications Ordered in Other Visits  Medication Dose Route Frequency Provider Last Rate Last Dose  . sodium chloride 0.9 % injection 10 mL  10 mL Intracatheter PRN Kalsoom K Khan, MD   10 mL at 09/02/12 1619    Review of Systems Review of Systems  Constitutional: Negative.   Respiratory: Negative.     Blood pressure 124/66, pulse 72, temperature 97.8 F (36.6 C), temperature source Temporal, resp. rate 14, height 5' 3" (1.6 m), weight 135 lb 9.6 oz (61.508 kg).  Physical Exam Physical Exam  Constitutional: She appears well-developed and well-nourished. No distress.  HENT:  Head: Normocephalic and atraumatic.  Eyes: EOM are normal. No scleral icterus.  Neck: Neck supple.  Cardiovascular: Normal rate and regular rhythm.   Pulmonary/Chest: Effort normal and breath sounds normal.       No palpable breast masses.  Abdominal: Soft. She exhibits no mass. There   is no tenderness.  Musculoskeletal: She exhibits no edema.       Well-healed right axillary scar. No axillary adenopathy.  Lymphadenopathy:    She has no cervical adenopathy.  Skin: Skin is warm and dry.       Vitiligo-like changes.  Psychiatric: She has a normal mood and affect. Her behavior is normal.    Data Reviewed MRI report  Assessment    Invasive lobular cancer right breast (T2N1)-we discussed breast conservation options versus mastectomy. She was to proceed with bilateral mastectomies and I also recommended right axillary lymph node dissection.    Plan    Bilateral mastectomies and right axillary lymph node dissection.  I have explained the  procedure, risks, and aftercare to her.  Risks include but are not limited to bleeding, infection, wound problems, anesthesia, chronic chest wall pain, nerve injury, seroma formation, lymphedema.  She seems to understand and agrees with the plan.       Jameria Bradway J 10/30/2012, 11:25 AM    

## 2012-11-02 ENCOUNTER — Encounter: Payer: Self-pay | Admitting: Adult Health

## 2012-11-02 ENCOUNTER — Other Ambulatory Visit (HOSPITAL_BASED_OUTPATIENT_CLINIC_OR_DEPARTMENT_OTHER): Payer: BC Managed Care – PPO | Admitting: Lab

## 2012-11-02 ENCOUNTER — Ambulatory Visit (HOSPITAL_BASED_OUTPATIENT_CLINIC_OR_DEPARTMENT_OTHER): Payer: BC Managed Care – PPO | Admitting: Adult Health

## 2012-11-02 ENCOUNTER — Telehealth: Payer: Self-pay | Admitting: *Deleted

## 2012-11-02 VITALS — BP 114/71 | HR 76 | Temp 97.6°F | Resp 20 | Ht 63.0 in | Wt 135.8 lb

## 2012-11-02 DIAGNOSIS — R209 Unspecified disturbances of skin sensation: Secondary | ICD-10-CM

## 2012-11-02 DIAGNOSIS — R600 Localized edema: Secondary | ICD-10-CM

## 2012-11-02 DIAGNOSIS — C50419 Malignant neoplasm of upper-outer quadrant of unspecified female breast: Secondary | ICD-10-CM

## 2012-11-02 DIAGNOSIS — R5383 Other fatigue: Secondary | ICD-10-CM

## 2012-11-02 DIAGNOSIS — R609 Edema, unspecified: Secondary | ICD-10-CM

## 2012-11-02 LAB — CBC WITH DIFFERENTIAL/PLATELET
BASO%: 2.3 % — ABNORMAL HIGH (ref 0.0–2.0)
Basophils Absolute: 0.1 10*3/uL (ref 0.0–0.1)
EOS%: 1.5 % (ref 0.0–7.0)
HGB: 10.8 g/dL — ABNORMAL LOW (ref 11.6–15.9)
MCH: 30.9 pg (ref 25.1–34.0)
MCV: 93.3 fL (ref 79.5–101.0)
MONO%: 17.1 % — ABNORMAL HIGH (ref 0.0–14.0)
NEUT#: 3.4 10*3/uL (ref 1.5–6.5)
RBC: 3.48 10*6/uL — ABNORMAL LOW (ref 3.70–5.45)
RDW: 14.9 % — ABNORMAL HIGH (ref 11.2–14.5)
lymph#: 0.6 10*3/uL — ABNORMAL LOW (ref 0.9–3.3)

## 2012-11-02 LAB — COMPREHENSIVE METABOLIC PANEL (CC13)
ALT: 9 U/L (ref 0–55)
AST: 13 U/L (ref 5–34)
Albumin: 2.8 g/dL — ABNORMAL LOW (ref 3.5–5.0)
Alkaline Phosphatase: 89 U/L (ref 40–150)
BUN: 11 mg/dL (ref 7.0–26.0)
Calcium: 8.8 mg/dL (ref 8.4–10.4)
Chloride: 111 mEq/L — ABNORMAL HIGH (ref 98–107)
Potassium: 4.2 mEq/L (ref 3.5–5.1)
Sodium: 138 mEq/L (ref 136–145)
Total Protein: 5.5 g/dL — ABNORMAL LOW (ref 6.4–8.3)

## 2012-11-02 NOTE — Telephone Encounter (Signed)
doppler of bilateral lower extremities

## 2012-11-02 NOTE — Patient Instructions (Signed)
Doing well.  We will bring you back for a lab only appt next week.  We will see you on 2/3 or 2/4 for f/u after your surgery.  Please call us if you have any questions or concerns.

## 2012-11-02 NOTE — Progress Notes (Signed)
OFFICE PROGRESS NOTE  CC  Cassie Moron, MD [address] [place] [address] Dr. Avel Peace Dr. Lurline Hare   DIAGNOSIS: 62 year old female with invasive lobular carcinoma of the right breast.  PRIOR THERAPY:  #1 patient was originally seen in the multidisciplinary breast clinic on a 8/28/ 2013 for a stage II a invasive lobular carcinoma that was ER positive.  #2 at the time it was recommended patient proceed with neoadjuvant chemotherapy. But did want A. Upfront sentinel lymph node biopsy and a Port-A-Cath placed.   #3 she is undergoing treatment with neoadjuvant Taxotere and Cytoxan. A total of 6 cycles is planned.  CURRENT THERAPY:  Cycle 6 day 8 of Taxotere and Cytoxan with Neulasta support.    INTERVAL HISTORY: Cassie Carney 62 y.o. female returns for follow up. She continues to be fatigued, but otherwise has no other questions or concerns.  Her numbness in her fingertips and toes is slightly improved.  Her MRI showed complete resolution of the previously enhancing breast nodule.  She has an appt. With Dr. Abbey Chatters on 10/30/12.   MEDICAL HISTORY: Past Medical History  Diagnosis Date  . Breast cancer   . Mitral valve prolapse   . Night sweats   . Anxiety   . Hot flashes   . PONV (postoperative nausea and vomiting)   . Arthritis     ALLERGIES:  is allergic to adhesive.  MEDICATIONS:  Current Outpatient Prescriptions  Medication Sig Dispense Refill  . ALPRAZolam (XANAX) 0.5 MG tablet Take 0.5 mg by mouth 3 (three) times daily as needed.      . metoprolol succinate (TOPROL-XL) 25 MG 24 hr tablet Take 25 mg by mouth at bedtime.       Marland Kitchen UNABLE TO FIND Cranial Prothesis  1 each  0   No current facility-administered medications for this visit.   Facility-Administered Medications Ordered in Other Visits  Medication Dose Route Frequency Provider Last Rate Last Dose  . sodium chloride 0.9 % injection 10 mL  10 mL Intracatheter PRN Victorino December,  MD   10 mL at 09/02/12 1619    SURGICAL HISTORY:  Past Surgical History  Procedure Date  . Broken arm   . Broken wrist   . Dilation and curettage of uterus   . Portacath placement 06/29/2012    Procedure: INSERTION PORT-A-CATH;  Surgeon: Adolph Pollack, MD;  Location: [facility];  Service: General;  Laterality: N/A;   ultrasound guided Port-A-Cath insertion    REVIEW OF SYSTEMS:   General: fatigue (+), night sweats (-), fever (-), pain (-) Lymph: palpable nodes (-) HEENT: vision changes (-), mucositis (-), gum bleeding (-), epistaxis (-) Cardiovascular: chest pain (-), palpitations (-) Pulmonary: shortness of breath (-), dyspnea on exertion (-), cough (-), hemoptysis (-) GI:  Early satiety (-), melena (-), dysphagia (-), nausea/vomiting (-), diarrhea (-) GU: dysuria (-), hematuria (-), incontinence (-) Musculoskeletal: joint swelling (-), joint pain (-), back pain (-) Neuro: weakness (-), numbness (-), headache (-), confusion (-) Skin: Rash (-), lesions (-), dryness (-) Psych: depression (-), suicidal/homicidal ideation (-), feeling of hopelessness (-)   PHYSICAL EXAMINATION:  BP 114/71  Pulse 76  Temp 97.6 F (36.4 C)  Resp 20  Ht 5\' 3"  (1.6 m)  Wt 135 lb 12.8 oz (61.598 kg)  BMI 24.06 kg/m2 General: Patient is a well appearing female in no acute distress HEENT: PERRLA, sclerae anicteric no conjunctival pallor, MMM Neck: supple, no palpable adenopathy Lungs: clear  to auscultation bilaterally, no wheezes, rhonchi, or rales Cardiovascular: regular rate rhythm, S1, S2, no murmurs, rubs or gallops Abdomen: Soft, non-tender, non-distended, normoactive bowel sounds, no HSM Extremities: warm and well perfused, no clubbing, cyanosis, 1+ edema in RLE, 2+ edema in LLE Skin: No rashes or lesions Neuro: Non-focal Right breast: Minimally palpable mass,  port site looks good ECOG PERFORMANCE STATUS: 0 - Asymptomatic    LABORATORY DATA: Lab Results  Component  Value Date   WBC 5.1 11/02/2012   HGB 10.8* 11/02/2012   HCT 32.4* 11/02/2012   MCV 93.3 11/02/2012   PLT 239 11/02/2012      Chemistry      Component Value Date/Time   NA 138 11/02/2012 1320   NA 141 06/26/2012 0830   K 4.2 11/02/2012 1320   K 4.3 06/26/2012 0830   CL 111* 11/02/2012 1320   CL 105 06/26/2012 0830   CO2 22 11/02/2012 1320   CO2 26 06/26/2012 0830   BUN 11.0 11/02/2012 1320   BUN 12 06/26/2012 0830   CREATININE 0.7 11/02/2012 1320   CREATININE 0.74 06/26/2012 0830      Component Value Date/Time   CALCIUM 8.8 11/02/2012 1320   CALCIUM 10.2 06/26/2012 0830   ALKPHOS 89 11/02/2012 1320   ALKPHOS 99 06/26/2012 0830   AST 13 11/02/2012 1320   AST 30 06/26/2012 0830   ALT 9 11/02/2012 1320   ALT 41* 06/26/2012 0830   BILITOT 0.35 11/02/2012 1320   BILITOT 0.4 06/26/2012 0830       RADIOGRAPHIC STUDIES:   ASSESSMENT: 63 year old female with  #1Stage II a (T2 N0) invasive lobular carcinoma of the right breast. Patient is now here for neoadjuvant chemotherapy consisting of Taxotere and Cytoxan.  #2 fatigue due to chemotherapy.  #3 numbness in the soles of her feet and fingertips  #4 lower extremity edema    PLAN:  #1 Doing well.  Her MRI results are fantastic.  She has her surgery scheduled for 1/28 with Dr. Abbey Chatters.    #2 She's stopped her Neurontin.  Her numbness is very minimal, we will continue to monitor.   #3 She has lower extremity edema.  I have ordered a doppler to ensure she doesn't have a DVT.    #4 She will return next week for labs only and after surgery with Dr. Welton Flakes.     All questions were answered. The patient knows to call the clinic with any problems, questions or concerns. We can certainly see the patient much sooner if necessary.  I spent 25 minutes counseling the patient face to face. The total time spent in the appointment was 30 minutes.  This case was reviewed with Dr. Welton Flakes.   [name] Lyn Hollingshead, NP Medical Oncology [facility] Phone: [phone]

## 2012-11-02 NOTE — Telephone Encounter (Signed)
Left voice message to inform the patient

## 2012-11-03 ENCOUNTER — Encounter: Payer: Self-pay | Admitting: Adult Health

## 2012-11-04 ENCOUNTER — Encounter (HOSPITAL_COMMUNITY): Payer: Self-pay | Admitting: Pharmacy Technician

## 2012-11-09 ENCOUNTER — Other Ambulatory Visit: Payer: Self-pay | Admitting: Medical Oncology

## 2012-11-09 ENCOUNTER — Ambulatory Visit (HOSPITAL_COMMUNITY)
Admission: RE | Admit: 2012-11-09 | Discharge: 2012-11-09 | Disposition: A | Discharge status: Home / Self Care | Payer: BC Managed Care – PPO | Source: Ambulatory Visit | Attending: Oncology | Admitting: Oncology

## 2012-11-09 ENCOUNTER — Other Ambulatory Visit (HOSPITAL_BASED_OUTPATIENT_CLINIC_OR_DEPARTMENT_OTHER): Payer: BC Managed Care – PPO | Admitting: Lab

## 2012-11-09 DIAGNOSIS — R609 Edema, unspecified: Secondary | ICD-10-CM | POA: Insufficient documentation

## 2012-11-09 DIAGNOSIS — C50919 Malignant neoplasm of unspecified site of unspecified female breast: Secondary | ICD-10-CM | POA: Insufficient documentation

## 2012-11-09 DIAGNOSIS — C50419 Malignant neoplasm of upper-outer quadrant of unspecified female breast: Secondary | ICD-10-CM

## 2012-11-09 DIAGNOSIS — R600 Localized edema: Secondary | ICD-10-CM

## 2012-11-09 DIAGNOSIS — Z79899 Other long term (current) drug therapy: Secondary | ICD-10-CM | POA: Insufficient documentation

## 2012-11-09 LAB — COMPREHENSIVE METABOLIC PANEL (CC13)
ALT: 16 U/L (ref 0–55)
AST: 18 U/L (ref 5–34)
Albumin: 3 g/dL — ABNORMAL LOW (ref 3.5–5.0)
Alkaline Phosphatase: 87 U/L (ref 40–150)
BUN: 13 mg/dL (ref 7.0–26.0)
CO2: 23 meq/L (ref 22–29)
Calcium: 9.2 mg/dL (ref 8.4–10.4)
Chloride: 110 meq/L — ABNORMAL HIGH (ref 98–107)
Creatinine: 0.7 mg/dL (ref 0.6–1.1)
Glucose: 98 mg/dL (ref 70–99)
Potassium: 4.3 meq/L (ref 3.5–5.1)
Sodium: 140 meq/L (ref 136–145)
Total Bilirubin: 0.41 mg/dL (ref 0.20–1.20)
Total Protein: 5.6 g/dL — ABNORMAL LOW (ref 6.4–8.3)

## 2012-11-09 LAB — CBC WITH DIFFERENTIAL/PLATELET
Basophils Absolute: 0.1 10*3/uL (ref 0.0–0.1)
EOS%: 5.8 % (ref 0.0–7.0)
Eosinophils Absolute: 0.3 10*3/uL (ref 0.0–0.5)
HGB: 11.6 g/dL (ref 11.6–15.9)
MONO#: 0.7 10*3/uL (ref 0.1–0.9)
NEUT#: 3 10*3/uL (ref 1.5–6.5)
RDW: 15.5 % — ABNORMAL HIGH (ref 11.2–14.5)
lymph#: 0.7 10*3/uL — ABNORMAL LOW (ref 0.9–3.3)

## 2012-11-09 NOTE — Progress Notes (Signed)
VASCULAR LAB PRELIMINARY  PRELIMINARY  PRELIMINARY  PRELIMINARY  Bilateral lower extremity venous duplex completed.    Preliminary report:  Bilateral:  No evidence of DVT, superficial thrombosis, or Baker's Cyst.   Tommye Lehenbauer, RVS 11/09/2012, 9:40 AM

## 2012-11-11 ENCOUNTER — Encounter (HOSPITAL_COMMUNITY): Payer: Self-pay

## 2012-11-11 ENCOUNTER — Encounter (HOSPITAL_COMMUNITY)
Admission: RE | Admit: 2012-11-11 | Discharge: 2012-11-11 | Disposition: A | Discharge status: Home / Self Care | Payer: BC Managed Care – PPO | Source: Ambulatory Visit | Attending: General Surgery | Admitting: General Surgery

## 2012-11-11 LAB — CBC WITH DIFFERENTIAL/PLATELET
Lymphs Abs: 1 10*3/uL (ref 0.7–4.0)
MCH: 30.6 pg (ref 26.0–34.0)
MCHC: 32.2 g/dL (ref 30.0–36.0)
Monocytes Absolute: 0.6 10*3/uL (ref 0.1–1.0)
Monocytes Relative: 8 % (ref 3–12)
Neutro Abs: 5.1 10*3/uL (ref 1.7–7.7)
Neutrophils Relative %: 73 % (ref 43–77)
WBC: 7 10*3/uL (ref 4.0–10.5)

## 2012-11-11 LAB — COMPREHENSIVE METABOLIC PANEL
Alkaline Phosphatase: 86 U/L (ref 39–117)
BUN: 10 mg/dL (ref 6–23)
Chloride: 105 mEq/L (ref 96–112)
Creatinine, Ser: 0.6 mg/dL (ref 0.50–1.10)
GFR calc Af Amer: >90 mL/min (ref >90)
GFR calc non Af Amer: >90 mL/min (ref >90)
Glucose, Bld: 120 mg/dL — ABNORMAL HIGH (ref 70–99)
Potassium: 4.1 mEq/L (ref 3.5–5.1)
Total Bilirubin: 0.3 mg/dL (ref 0.3–1.2)

## 2012-11-11 LAB — PROTIME-INR
INR: 1.01 (ref 0.00–1.49)
Prothrombin Time: 13.2 seconds (ref 11.6–15.2)

## 2012-11-11 NOTE — Pre-Procedure Instructions (Signed)
ORAH SONNEN  11/11/2012   Your procedure is scheduled on:  Nov 17, 2012  Report to [facility] at 11:00 AM.  Call this number if you have problems the morning of surgery: [phone]   Remember:   Do not eat food or drink liquids after midnight.   Take these medicines the morning of surgery with A SIP OF WATER: metoprolol, alprazolam   Do not wear jewelry, make-up or nail polish.  Do not wear lotions, powders, or perfumes. You may wear deodorant.  Do not shave 48 hours prior to surgery. Men may shave face and neck.  Do not bring valuables to the hospital.  Contacts, dentures or bridgework may not be worn into surgery.  Leave suitcase in the car. After surgery it may be brought to your room.  For patients admitted to the hospital, checkout time is 11:00 AM the day of  discharge.   Patients discharged the day of surgery will not be allowed to drive  home.  Name and phone number of your driver:   Special Instructions: Shower using CHG 2 nights before surgery and the night before surgery.  If you shower the day of surgery use CHG.  Use special wash - you have one bottle of CHG for all showers.  You should use approximately 1/3 of the bottle for each shower.   Please read over the following fact sheets that you were given: Pain Booklet, Coughing and Deep Breathing, Blood Transfusion Information and Surgical Site Infection Prevention

## 2012-11-16 MED ORDER — CEFAZOLIN SODIUM-DEXTROSE 2-3 GM-% IV SOLR
2.0000 g | INTRAVENOUS | Status: AC
  Administered 2012-11-17: 2 g via INTRAVENOUS
  Filled 2012-11-16: qty 50

## 2012-11-16 NOTE — Progress Notes (Signed)
Pt notified of time change;to arrive at 1130 

## 2012-11-17 ENCOUNTER — Encounter (HOSPITAL_COMMUNITY): Payer: Self-pay

## 2012-11-17 ENCOUNTER — Ambulatory Visit (HOSPITAL_COMMUNITY): Payer: BC Managed Care – PPO | Admitting: Certified Registered"

## 2012-11-17 ENCOUNTER — Encounter (HOSPITAL_COMMUNITY)
Admission: RE | Disposition: A | Discharge status: Home / Self Care | Payer: Self-pay | Source: Ambulatory Visit | Attending: General Surgery

## 2012-11-17 ENCOUNTER — Ambulatory Visit (HOSPITAL_COMMUNITY)
Admission: RE | Admit: 2012-11-17 | Discharge: 2012-11-18 | Disposition: A | Discharge status: Home / Self Care | Payer: BC Managed Care – PPO | Source: Ambulatory Visit | Attending: General Surgery | Admitting: General Surgery

## 2012-11-17 ENCOUNTER — Encounter (HOSPITAL_COMMUNITY): Payer: Self-pay | Admitting: Certified Registered"

## 2012-11-17 DIAGNOSIS — Z01812 Encounter for preprocedural laboratory examination: Secondary | ICD-10-CM | POA: Insufficient documentation

## 2012-11-17 DIAGNOSIS — C50919 Malignant neoplasm of unspecified site of unspecified female breast: Secondary | ICD-10-CM | POA: Insufficient documentation

## 2012-11-17 DIAGNOSIS — R920 Mammographic microcalcification found on diagnostic imaging of breast: Secondary | ICD-10-CM

## 2012-11-17 DIAGNOSIS — N6089 Other benign mammary dysplasias of unspecified breast: Secondary | ICD-10-CM

## 2012-11-17 DIAGNOSIS — C773 Secondary and unspecified malignant neoplasm of axilla and upper limb lymph nodes: Secondary | ICD-10-CM | POA: Insufficient documentation

## 2012-11-17 DIAGNOSIS — I059 Rheumatic mitral valve disease, unspecified: Secondary | ICD-10-CM | POA: Insufficient documentation

## 2012-11-17 HISTORY — PX: MASTECTOMY WITH AXILLARY LYMPH NODE DISSECTION: SHX5661

## 2012-11-17 HISTORY — PX: TOTAL MASTECTOMY: SHX6129

## 2012-11-17 HISTORY — PX: SIMPLE MASTECTOMY: SHX2409

## 2012-11-17 SURGERY — MASTECTOMY, SIMPLE
Anesthesia: General | Site: Breast | Laterality: Right | Wound class: Clean

## 2012-11-17 MED ORDER — ACETAMINOPHEN 10 MG/ML IV SOLN
INTRAVENOUS | Status: AC
  Filled 2012-11-17: qty 100

## 2012-11-17 MED ORDER — ONDANSETRON HCL 4 MG/2ML IJ SOLN: 4.0000 mg | INTRAMUSCULAR | Status: DC | PRN

## 2012-11-17 MED ORDER — OXYCODONE HCL 5 MG PO TABS: 5.0000 mg | ORAL_TABLET | Freq: Once | ORAL | Status: DC | PRN

## 2012-11-17 MED ORDER — ACETAMINOPHEN 10 MG/ML IV SOLN
1000.0000 mg | Freq: Once | INTRAVENOUS | Status: AC
  Administered 2012-11-17: 1000 mg via INTRAVENOUS
  Filled 2012-11-17: qty 100

## 2012-11-17 MED ORDER — LACTATED RINGERS IV SOLN
INTRAVENOUS | Status: DC
  Administered 2012-11-17 (×2): via INTRAVENOUS

## 2012-11-17 MED ORDER — ALPRAZOLAM 0.5 MG PO TABS: 0.5000 mg | ORAL_TABLET | Freq: Four times a day (QID) | ORAL | Status: DC | PRN

## 2012-11-17 MED ORDER — SCOPOLAMINE 1 MG/3DAYS TD PT72
MEDICATED_PATCH | TRANSDERMAL | Status: AC
  Administered 2012-11-17: 1.5 mg via TRANSDERMAL
  Filled 2012-11-17: qty 1

## 2012-11-17 MED ORDER — OXYCODONE HCL 5 MG/5ML PO SOLN: 5.0000 mg | Freq: Once | ORAL | Status: DC | PRN

## 2012-11-17 MED ORDER — OXYCODONE-ACETAMINOPHEN 5-325 MG PO TABS
1.0000 | ORAL_TABLET | ORAL | Status: DC | PRN
  Administered 2012-11-18: 1 via ORAL
  Filled 2012-11-17: qty 1

## 2012-11-17 MED ORDER — PHENYLEPHRINE HCL 10 MG/ML IJ SOLN
INTRAMUSCULAR | Status: DC | PRN
  Administered 2012-11-17: 80 ug via INTRAVENOUS

## 2012-11-17 MED ORDER — 0.9 % SODIUM CHLORIDE (POUR BTL) OPTIME
TOPICAL | Status: DC | PRN
  Administered 2012-11-17 (×2): 1000 mL

## 2012-11-17 MED ORDER — METOPROLOL SUCCINATE ER 25 MG PO TB24
25.0000 mg | ORAL_TABLET | Freq: Every day | ORAL | Status: DC
  Filled 2012-11-17 (×2): qty 1

## 2012-11-17 MED ORDER — SCOPOLAMINE 1 MG/3DAYS TD PT72
1.0000 | MEDICATED_PATCH | TRANSDERMAL | Status: DC
Start: 2012-11-17 — End: 2012-11-17
  Administered 2012-11-17: 1.5 mg via TRANSDERMAL

## 2012-11-17 MED ORDER — MIDAZOLAM HCL 5 MG/5ML IJ SOLN
INTRAMUSCULAR | Status: DC | PRN
  Administered 2012-11-17: 2 mg via INTRAVENOUS

## 2012-11-17 MED ORDER — CEFAZOLIN SODIUM 1-5 GM-% IV SOLN
1.0000 g | Freq: Four times a day (QID) | INTRAVENOUS | Status: DC
  Administered 2012-11-18 (×2): 1 g via INTRAVENOUS
  Filled 2012-11-17 (×3): qty 50

## 2012-11-17 MED ORDER — HYDROMORPHONE HCL PF 1 MG/ML IJ SOLN: 0.2500 mg | INTRAMUSCULAR | Status: DC | PRN

## 2012-11-17 MED ORDER — FENTANYL CITRATE 0.05 MG/ML IJ SOLN
INTRAMUSCULAR | Status: DC | PRN
  Administered 2012-11-17: 50 ug via INTRAVENOUS
  Administered 2012-11-17: 25 ug via INTRAVENOUS
  Administered 2012-11-17 (×2): 100 ug via INTRAVENOUS
  Administered 2012-11-17 (×2): 50 ug via INTRAVENOUS
  Administered 2012-11-17: 25 ug via INTRAVENOUS

## 2012-11-17 MED ORDER — MORPHINE SULFATE 2 MG/ML IJ SOLN
2.0000 mg | INTRAMUSCULAR | Status: DC | PRN
Start: 2012-11-17 — End: 2012-11-18
  Administered 2012-11-17 – 2012-11-18 (×2): 2 mg via INTRAVENOUS
  Filled 2012-11-17: qty 1
  Filled 2012-11-17: qty 2

## 2012-11-17 MED ORDER — KCL-LACTATED RINGERS-D5W 20 MEQ/L IV SOLN
INTRAVENOUS | Status: DC
  Administered 2012-11-17: 19:00:00 via INTRAVENOUS
  Filled 2012-11-17 (×6): qty 1000

## 2012-11-17 MED ORDER — PROMETHAZINE HCL 25 MG/ML IJ SOLN
INTRAMUSCULAR | Status: AC
  Administered 2012-11-17: 6.25 mg
  Filled 2012-11-17: qty 1

## 2012-11-17 MED ORDER — LIDOCAINE HCL (CARDIAC) 20 MG/ML IV SOLN
INTRAVENOUS | Status: DC | PRN
  Administered 2012-11-17: 50 mg via INTRAVENOUS

## 2012-11-17 MED ORDER — ONDANSETRON HCL 4 MG PO TABS: 4.0000 mg | ORAL_TABLET | Freq: Four times a day (QID) | ORAL | Status: DC | PRN

## 2012-11-17 MED ORDER — PROPOFOL 10 MG/ML IV BOLUS
INTRAVENOUS | Status: DC | PRN
  Administered 2012-11-17: 140 mg via INTRAVENOUS

## 2012-11-17 MED ORDER — ONDANSETRON HCL 4 MG/2ML IJ SOLN
INTRAMUSCULAR | Status: DC | PRN
  Administered 2012-11-17: 4 mg via INTRAVENOUS

## 2012-11-17 SURGICAL SUPPLY — 43 items
APPLIER CLIP 9.375 MED OPEN (MISCELLANEOUS) ×3
BINDER BREAST LRG (GAUZE/BANDAGES/DRESSINGS) IMPLANT
BINDER BREAST XLRG (GAUZE/BANDAGES/DRESSINGS) IMPLANT
CANISTER SUCTION 2500CC (MISCELLANEOUS) ×3 IMPLANT
CHLORAPREP W/TINT 26ML (MISCELLANEOUS) ×3 IMPLANT
CLIP APPLIE 9.375 MED OPEN (MISCELLANEOUS) ×2 IMPLANT
CLOTH BEACON ORANGE TIMEOUT ST (SAFETY) ×3 IMPLANT
COVER SURGICAL LIGHT HANDLE (MISCELLANEOUS) ×3 IMPLANT
DERMABOND ADVANCED (GAUZE/BANDAGES/DRESSINGS) ×2
DERMABOND ADVANCED .7 DNX12 (GAUZE/BANDAGES/DRESSINGS) ×4 IMPLANT
DRAIN CHANNEL 19F RND (DRAIN) ×6 IMPLANT
DRAPE CHEST BREAST 15X10 FENES (DRAPES) ×3 IMPLANT
DRAPE UTILITY 15X26 W/TAPE STR (DRAPE) ×6 IMPLANT
DRSG PAD ABDOMINAL 8X10 ST (GAUZE/BANDAGES/DRESSINGS) ×3 IMPLANT
ELECT CAUTERY BLADE 6.4 (BLADE) ×3 IMPLANT
ELECT REM PT RETURN 9FT ADLT (ELECTROSURGICAL) ×3
ELECTRODE REM PT RTRN 9FT ADLT (ELECTROSURGICAL) ×2 IMPLANT
EVACUATOR SILICONE 100CC (DRAIN) ×9 IMPLANT
GLOVE BIOGEL PI IND STRL 6.5 (GLOVE) ×2 IMPLANT
GLOVE BIOGEL PI IND STRL 7.0 (GLOVE) ×2 IMPLANT
GLOVE BIOGEL PI IND STRL 7.5 (GLOVE) ×2 IMPLANT
GLOVE BIOGEL PI IND STRL 8 (GLOVE) ×2 IMPLANT
GLOVE BIOGEL PI INDICATOR 6.5 (GLOVE) ×1
GLOVE BIOGEL PI INDICATOR 7.0 (GLOVE) ×1
GLOVE BIOGEL PI INDICATOR 7.5 (GLOVE) ×1
GLOVE BIOGEL PI INDICATOR 8 (GLOVE) ×1
GLOVE ECLIPSE 7.5 STRL STRAW (GLOVE) ×3 IMPLANT
GLOVE ECLIPSE 8.0 STRL XLNG CF (GLOVE) ×6 IMPLANT
GLOVE SURG SS PI 7.0 STRL IVOR (GLOVE) ×3 IMPLANT
GOWN STRL NON-REIN LRG LVL3 (GOWN DISPOSABLE) ×9 IMPLANT
KIT BASIN OR (CUSTOM PROCEDURE TRAY) ×3 IMPLANT
KIT ROOM TURNOVER OR (KITS) ×3 IMPLANT
NS IRRIG 1000ML POUR BTL (IV SOLUTION) ×6 IMPLANT
PACK GENERAL/GYN (CUSTOM PROCEDURE TRAY) ×3 IMPLANT
PAD ARMBOARD 7.5X6 YLW CONV (MISCELLANEOUS) ×12 IMPLANT
SPECIMEN JAR X LARGE (MISCELLANEOUS) IMPLANT
SPONGE GAUZE 4X4 12PLY (GAUZE/BANDAGES/DRESSINGS) ×3 IMPLANT
SPONGE LAP 18X18 X RAY DECT (DISPOSABLE) ×3 IMPLANT
SUT ETHILON 2 0 FS 18 (SUTURE) ×9 IMPLANT
SUT MON AB 4-0 PC3 18 (SUTURE) ×6 IMPLANT
SUT VIC AB 3-0 SH 18 (SUTURE) ×9 IMPLANT
TOWEL OR 17X24 6PK STRL BLUE (TOWEL DISPOSABLE) ×3 IMPLANT
TOWEL OR 17X26 10 PK STRL BLUE (TOWEL DISPOSABLE) ×3 IMPLANT

## 2012-11-17 NOTE — Transfer of Care (Signed)
Immediate Anesthesia Transfer of Care Note  Patient: Cassie Carney  Procedure(s) Performed: Procedure(s) (LRB) with comments: TOTAL MASTECTOMY (Left) MASTECTOMY WITH AXILLARY LYMPH NODE DISSECTION (Right)  Patient Location: PACU  Anesthesia Type:General  Level of Consciousness: awake and alert   Airway & Oxygen Therapy: Patient Spontanous Breathing and Patient connected to face mask oxygen  Post-op Assessment: Report given to PACU RN and Post -op Vital signs reviewed and stable  Post vital signs: Reviewed and stable  Complications: No apparent anesthesia complications

## 2012-11-17 NOTE — Anesthesia Postprocedure Evaluation (Signed)
  Anesthesia Post-op Note  Patient: Cassie Carney  Procedure(s) Performed: Procedure(s) (LRB) with comments: TOTAL MASTECTOMY (Left) MASTECTOMY WITH AXILLARY LYMPH NODE DISSECTION (Right)  Patient Location: PACU  Anesthesia Type:General  Level of Consciousness: awake and alert   Airway and Oxygen Therapy: Patient Spontanous Breathing and Patient connected to nasal cannula oxygen  Post-op Pain: mild  Post-op Assessment: Post-op Vital signs reviewed, Patient's Cardiovascular Status Stable, Respiratory Function Stable, Patent Airway and No signs of Nausea or vomiting  Post-op Vital Signs: Reviewed and stable  Complications: No apparent anesthesia complications

## 2012-11-17 NOTE — Progress Notes (Signed)
Patient admitted to [facility] Room 15 from PACU. S/P bilateral mastectomies. Breast binder and dressings intact. Patient awake and oriented. Call bell in reach. Husband at bedside.

## 2012-11-17 NOTE — Op Note (Signed)
Operative Note  Cassie Carney female 62 y.o. 11/17/2012  PREOPERATIVE DX:  Invasive right breast cancer (T2N1)  POSTOPERATIVE DX:  Same  PROCEDURE:  1. Left total mastectomy. 2. Right modified radical mastectomy.         Surgeon: Adolph Pollack   Assistants: none  Anesthesia: General endotracheal anesthesia  Indications:  This is a 62 year old female with right breast cancer that metastasized lymph nodes. She underwent preoperative neoadjuvant chemotherapy and responded well to this. She now presents for the above procedures.    Procedure Detail:  She was brought to the operating room placed supine on the operating table and a general anesthetic was given. The chest walls axially and upper arms were sterilely prepped and draped.  An elliptical incision was made in the left breast to include the nipple areolar complex. Subcutaneous flaps were then raised to the clavicle superiorly, the lateral border of the sternum medially, the anterior rectus sheath inferiorly, and the latissimus muscle laterally. The breast tissues was then dissected free of the pectoralis major muscle and fascia using electrocautery. The medial aspect of the breast was marked and sent to pathology.  The wound was irrigated and bleeding was controlled with electrocautery. A stab wound was made in the inferior flap and a 19 Blake drain was placed into the wound and anchored to the skin with nylon suture. The subcutaneous tissue was subsequently closed with interrupted 3-0 Vicryl sutures. The skin is closed with a running 4-0 Monocryl subcuticular stitch.  Next, the right breast was approached. An elliptical incision was made to include the nipple areolar complex. Subcutaneous flaps were raised superiorly to the clavicle and care was taken to avoid disrupting where the Port-A-Cath was as I lifted this up with the subcutaneous flap, Medial to the lateral border of the sternum, inferiorly to the anterior rectus sheath,  And laterally to latissimus muscle. The breast was then dissected off the pectoralis major muscle and fascia. Using sharp dissection I then incised the clavipectoral fascia and entered the axillary space.  The right axillary vein was identified. Lymphatic tissue and lymph nodes were dissected free from the inferior aspect of the right axillary vein. The long thoracic nerve and thoracodorsal nerve were identified. Lymphatic tissue was dissected from the space between these two nerves. Multiple palpable lymph nodes were noted in the specimen. The axillary contents was then further dissected using electrocautery. The breast was then dissected free from the chest wall. The right breast and axilla contents were then handed off the field. The medial aspect of the breast was marked with a suture.  The right chest wall wound was then irrigated with saline and bleeding controlled with electrocautery. 2 stab incisions were made in the inferior flap.  A 19 Blake drain was then placed into the axilla and one into the chest wound and both anchored to the skin with nylon suture.  Hemostasis was adequate. The subcutaneous tissue was closed with interrupted 3-0 Vicryl sutures. The skin was closed with a 4-0 Monocryl subcuticular stitch.  Dermabond and sterile dressings were then applied to both wounds. She tolerated the procedures well without any apparent complications and was taken to the recovery room in satisfactory condition.  Estimated Blood Loss:  300 mL         Drains: JACKSON-PRATT (JP)  Blood Given: none          Specimens: Left breast. Right breast and axillary contents.        Complications:  * No complications entered  in OR log *         Disposition: PACU - hemodynamically stable.         Condition: stable

## 2012-11-17 NOTE — Progress Notes (Signed)
Pt c/o some feelinf of numbness aroundright elbow area. States she had a prev childhood injury to RUE. Right fingers warm, 2+ r rad pulse. R arm elev on a pillow. Will cont to monitor. Dr Sampson Goon here and aware.

## 2012-11-17 NOTE — Interval H&P Note (Signed)
History and Physical Interval Note:  11/17/2012 1:33 PM  Cassie Carney  has presented today for surgery, with the diagnosis of right breast cancer  The various methods of treatment have been discussed with the patient and family. After consideration of risks, benefits and other options for treatment, the patient has consented to  Procedure(s) (LRB) with comments: TOTAL MASTECTOMY (Left) - bilateral mastectomies and right axillary lymph node dissection MASTECTOMY WITH AXILLARY LYMPH NODE DISSECTION (Right) as a surgical intervention .  The patient's history has been reviewed, patient examined, no change in status, stable for surgery.  I have reviewed the patient's chart and labs.  Questions were answered to the patient's satisfaction.     Jalin Erpelding Shela Commons

## 2012-11-17 NOTE — Anesthesia Preprocedure Evaluation (Signed)
Anesthesia Evaluation  Patient identified by MRN, date of birth, ID band Patient awake    Reviewed: Allergy & Precautions, H&P , NPO status , Patient's Chart, lab work & pertinent test results  History of Anesthesia Complications (+) PONV  Airway Mallampati: II TM Distance: >3 FB Neck ROM: Full    Dental No notable dental hx. (+) Teeth Intact and Dental Advisory Given   Pulmonary neg pulmonary ROS,  breath sounds clear to auscultation  Pulmonary exam normal       Cardiovascular negative cardio ROS  Rhythm:Regular Rate:Normal     Neuro/Psych negative neurological ROS  negative psych ROS   GI/Hepatic negative GI ROS, Neg liver ROS,   Endo/Other  negative endocrine ROS  Renal/GU negative Renal ROS  negative genitourinary   Musculoskeletal   Abdominal   Peds  Hematology negative hematology ROS (+)   Anesthesia Other Findings   Reproductive/Obstetrics negative OB ROS                           Anesthesia Physical Anesthesia Plan  ASA: II  Anesthesia Plan: General   Post-op Pain Management:    Induction: Intravenous  Airway Management Planned: LMA  Additional Equipment:   Intra-op Plan:   Post-operative Plan: Extubation in OR  Informed Consent: I have reviewed the patients History and Physical, chart, labs and discussed the procedure including the risks, benefits and alternatives for the proposed anesthesia with the patient or authorized representative who has indicated his/her understanding and acceptance.   Dental advisory given  Plan Discussed with: CRNA  Anesthesia Plan Comments:         Anesthesia Quick Evaluation

## 2012-11-17 NOTE — Progress Notes (Signed)
Pt says numbness in right elbow area still there a little bit, but "feels better".

## 2012-11-17 NOTE — H&P (View-Only) (Signed)
Patient ID: Cassie Carney, female   DOB: Sep 21, 1951, 62 y.o.   MRN: [id number]  Chief Complaint  Patient presents with  . Routine Post Op    reck breast    HPI Cassie Carney is a 62 y.o. female.   HPI  She noticed a mass in her breast the summer of 2013. She noticed a slight bit of skin dimpling in the area which was the upper outer quadrant. She subsequently underwent imaging studies. An ultrasound-guided biopsy of the mass was performed which demonstrated invasive lobular carcinoma. On ultrasound, the mass was 1.7 cm in size. Postbiopsy MRI demonstrated a 3.8 cm mass was close to the pectoralis muscle. The tumor is estrogen receptor and progesterone receptor positive, her 2 negative, proliferation rate is 18%.  She underwent Port-A-Cath placement and right axillary sentinel lymph node biopsy were both lymph nodes were positive for tumor. She subsequently had neoadjuvant chemotherapy and based on recent MRI the lesion is completely resolved. She is here to discuss definitive surgery.  She is here with her husband.   Past Medical History  Diagnosis Date  . Breast cancer   . Mitral valve prolapse   . Night sweats   . Anxiety   . Hot flashes   . PONV (postoperative nausea and vomiting)   . Arthritis     Past Surgical History  Procedure Date  . Broken arm   . Broken wrist   . Dilation and curettage of uterus   . Portacath placement 06/29/2012    Procedure: INSERTION PORT-A-CATH;  Surgeon: Adolph Pollack, MD;  Location: [facility];  Service: General;  Laterality: N/A;   ultrasound guided Port-A-Cath insertion    Family History  Problem Relation Age of Onset  . Cancer Father     squamous cell carcinoma    Social History History  Substance Use Topics  . Smoking status: Never Smoker   . Smokeless tobacco: Never Used  . Alcohol Use: 4.2 oz/week    7 Glasses of wine per week    Allergies  Allergen Reactions  . Adhesive (Tape)     Rash    Current Outpatient  Prescriptions  Medication Sig Dispense Refill  . ALPRAZolam (XANAX) 0.5 MG tablet Take 0.5 mg by mouth 3 (three) times daily as needed.      . metoprolol succinate (TOPROL-XL) 25 MG 24 hr tablet Take 25 mg by mouth at bedtime.       Marland Kitchen UNABLE TO FIND Cranial Prothesis  1 each  0   No current facility-administered medications for this visit.   Facility-Administered Medications Ordered in Other Visits  Medication Dose Route Frequency Provider Last Rate Last Dose  . sodium chloride 0.9 % injection 10 mL  10 mL Intracatheter PRN Victorino December, MD   10 mL at 09/02/12 1619    Review of Systems Review of Systems  Constitutional: Negative.   Respiratory: Negative.     Blood pressure 124/66, pulse 72, temperature 97.8 F (36.6 C), temperature source Temporal, resp. rate 14, height 5\' 3"  (1.6 m), weight 135 lb 9.6 oz (61.508 kg).  Physical Exam Physical Exam  Constitutional: She appears well-developed and well-nourished. No distress.  HENT:  Head: Normocephalic and atraumatic.  Eyes: EOM are normal. No scleral icterus.  Neck: Neck supple.  Cardiovascular: Normal rate and regular rhythm.   Pulmonary/Chest: Effort normal and breath sounds normal.       No palpable breast masses.  Abdominal: Soft. She exhibits no mass. There  is no tenderness.  Musculoskeletal: She exhibits no edema.       Well-healed right axillary scar. No axillary adenopathy.  Lymphadenopathy:    She has no cervical adenopathy.  Skin: Skin is warm and dry.       Vitiligo-like changes.  Psychiatric: She has a normal mood and affect. Her behavior is normal.    Data Reviewed MRI report  Assessment    Invasive lobular cancer right breast (T2N1)-we discussed breast conservation options versus mastectomy. She was to proceed with bilateral mastectomies and I also recommended right axillary lymph node dissection.    Plan    Bilateral mastectomies and right axillary lymph node dissection.  I have explained the  procedure, risks, and aftercare to her.  Risks include but are not limited to bleeding, infection, wound problems, anesthesia, chronic chest wall pain, nerve injury, seroma formation, lymphedema.  She seems to understand and agrees with the plan.       Cassie Carney J 10/30/2012, 11:25 AM

## 2012-11-17 NOTE — Preoperative (Signed)
Beta Blockers   Reason not to administer Beta Blockers:Not Applicable beta blocker taken 1/27 @ 2230

## 2012-11-18 ENCOUNTER — Encounter (HOSPITAL_COMMUNITY): Payer: Self-pay | Admitting: General Surgery

## 2012-11-18 MED ORDER — ONDANSETRON HCL 4 MG PO TABS: 4.0000 mg | ORAL_TABLET | ORAL | Status: DC | PRN

## 2012-11-18 MED ORDER — OXYCODONE HCL 5 MG PO TABS: 5.0000 mg | ORAL_TABLET | ORAL | Status: DC | PRN

## 2012-11-18 NOTE — Progress Notes (Signed)
1 Day Post-Op  Subjective: Some nausea last night.  Comfortable.  Objective: Vital signs in last 24 hours: Temp:  [97.6 F (36.4 C)-98.6 F (37 C)] 98.6 F (37 C) (01/29 0505) Pulse Rate:  [50-64] 62  (01/29 0505) Resp:  [11-20] 16  (01/29 0505) BP: (91-127)/(43-72) 122/67 mmHg (01/29 0505) SpO2:  [97 %-100 %] 98 % (01/29 0505)    Intake/Output from previous day: 01/28 0701 - 01/29 0700 In: 2057.5 [I.V.:2057.5] Out: 397.5 [Urine:200; Drains:197.5] Intake/Output this shift:    PE: General- In NAD Chest-flaps flat, thin serosanguinous drain output  Lab Results:  No results found for this basename: WBC:2,HGB:2,HCT:2,PLT:2 in the last 72 hours BMET No results found for this basename: NA:2,K:2,CL:2,CO2:2,GLUCOSE:2,BUN:2,CREATININE:2,CALCIUM:2 in the last 72 hours PT/INR No results found for this basename: LABPROT:2,INR:2 in the last 72 hours Comprehensive Metabolic Panel:    Component Value Date/Time   NA 140 11/11/2012 1225   NA 140 11/09/2012 1017   K 4.1 11/11/2012 1225   K 4.3 11/09/2012 1017   CL 105 11/11/2012 1225   CL 110* 11/09/2012 1017   CO2 24 11/11/2012 1225   CO2 23 11/09/2012 1017   BUN 10 11/11/2012 1225   BUN 13.0 11/09/2012 1017   CREATININE 0.60 11/11/2012 1225   CREATININE 0.7 11/09/2012 1017   GLUCOSE 120* 11/11/2012 1225   GLUCOSE 98 11/09/2012 1017   CALCIUM 9.4 11/11/2012 1225   CALCIUM 9.2 11/09/2012 1017   AST 24 11/11/2012 1225   AST 18 11/09/2012 1017   ALT 21 11/11/2012 1225   ALT 16 11/09/2012 1017   ALKPHOS 86 11/11/2012 1225   ALKPHOS 87 11/09/2012 1017   BILITOT 0.3 11/11/2012 1225   BILITOT 0.41 11/09/2012 1017   PROT 6.0 11/11/2012 1225   PROT 5.6* 11/09/2012 1017   ALBUMIN 3.3* 11/11/2012 1225   ALBUMIN 3.0* 11/09/2012 1017     Studies/Results: No results found.  Anti-infectives: Anti-infectives     Start     Dose/Rate Route Frequency Ordered Stop   11/17/12 1845   ceFAZolin (ANCEF) IVPB 1 g/50 mL premix        1 g 100 mL/hr over 30  Minutes Intravenous Every 6 hours 11/17/12 1839 11/18/12 1244   11/17/12 0600   ceFAZolin (ANCEF) IVPB 2 g/50 mL premix        2 g 100 mL/hr over 30 Minutes Intravenous On call to O.R. 11/16/12 1440 11/17/12 1348          Assessment Active Problems:  Right breast cancer s/p left total mastectomy, right modified radical mastectomy 1/28-some nausea otherwise stable overnight.     LOS: 1 day   Plan: Discharge later today if tolerating liquid diet.  Instructions given.   Cassie Carney 11/18/2012

## 2012-11-18 NOTE — Progress Notes (Signed)
Rn gave patient instructions on medications and prescriptions. Patient had no questions about medications. Rn went over drain care with the patient and husband and both verbalized understanding of how to empty drains and when to call the doctor for questions. Pt was given supplies to empty drains and drain sheets to record output. They had no further questions.

## 2012-11-23 ENCOUNTER — Ambulatory Visit (INDEPENDENT_AMBULATORY_CARE_PROVIDER_SITE_OTHER): Payer: BC Managed Care – PPO | Admitting: General Surgery

## 2012-11-23 ENCOUNTER — Encounter (INDEPENDENT_AMBULATORY_CARE_PROVIDER_SITE_OTHER): Payer: Self-pay | Admitting: General Surgery

## 2012-11-23 VITALS — BP 116/64 | HR 68 | Temp 98.1°F | Resp 20 | Ht 63.0 in | Wt 134.4 lb

## 2012-11-23 DIAGNOSIS — Z9889 Other specified postprocedural states: Secondary | ICD-10-CM

## 2012-11-23 NOTE — Patient Instructions (Signed)
Call when the drain output is less than 30 cc per day. Start using your arms more as we discussed.

## 2012-11-23 NOTE — Progress Notes (Signed)
Procedure:  Right modified radical mastectomy. Left total mastectomy.  Date:  11/17/2038  Pathology: Right T2N1.  Left benign  History:  She presents for her first postoperative visit. We discussed the pathology. She is having some soreness. 2 out of the 3 drains are draining less than 30 cc per day.  She is here with her husband.  Exam: General- Is in NAD. Chest wall-both incisions are clean and intact. Output from the drains a serous.  The right medial drain and the left drain were removed.  Assessment:  T2N1 Right breast cancer status post right modified radical mastectomy and left total mastectomy-wounds are healing well. Lateral drain on the right side needs to remain in.  Plan:  She was instructed to call when the drain output is less than 30 cc per day. Increase activities with her arms. Return visit 3 weeks.

## 2012-11-24 ENCOUNTER — Telehealth: Payer: Self-pay | Admitting: Oncology

## 2012-11-24 ENCOUNTER — Encounter: Payer: Self-pay | Admitting: Oncology

## 2012-11-24 ENCOUNTER — Ambulatory Visit (HOSPITAL_BASED_OUTPATIENT_CLINIC_OR_DEPARTMENT_OTHER): Payer: BC Managed Care – PPO | Admitting: Oncology

## 2012-11-24 VITALS — BP 103/62 | HR 60 | Temp 98.4°F | Resp 18 | Ht 63.0 in | Wt 133.3 lb

## 2012-11-24 DIAGNOSIS — C50419 Malignant neoplasm of upper-outer quadrant of unspecified female breast: Secondary | ICD-10-CM

## 2012-11-24 DIAGNOSIS — Z170 Estrogen receptor positive status [ER+]: Secondary | ICD-10-CM

## 2012-11-24 NOTE — Patient Instructions (Addendum)
Refer to radiation oncology  I will see you back in 1 month

## 2012-11-24 NOTE — Progress Notes (Signed)
OFFICE PROGRESS NOTE  CC  [name], MD [address] [place] [address] Dr. Avel Peace Dr. Lurline Hare   DIAGNOSIS: 62 year old female with invasive lobular carcinoma of the right breast.  PRIOR THERAPY:  #1 patient was originally seen in the multidisciplinary breast clinic on a 8/28/ 2013 for a stage II a invasive lobular carcinoma that was ER positive.  #2 at the time it was recommended patient proceed with neoadjuvant chemotherapy. But did want A. Upfront sentinel lymph node biopsy and a Port-A-Cath placed.   #3she underwent treatment with neoadjuvant Taxotere and Cytoxan starting on 07/01/2012 through 10/15/2012. She completed a total of 6 cycles  #4Patient is now status post bilateral mastectomies performed on 11/17/1998 413. The final pathology revealed on the right 1.5 cm invasive grade 2 lobular carcinoma 4 sentinel nodes were negative for metastatic disease tumor was ER positive PR positive HER-2/neu negative. On the left side simple mastectomy was performed which did not reveal any malignancy but did show atypical lobular hyperplasia.  CURRENT THERAPY:  Refer to radiation oncology INTERVAL HISTORY: Cassie Carney 62 y.o. female returns for follow up.patient is now status post bilateral mastectomies performed on 11/17/2012. Overall she tolerated her surgery well without any significant problems. Overall she feels well today and has no complaints  MEDICAL HISTORY: Past Medical History  Diagnosis Date  . Breast cancer   . Mitral valve prolapse   . Night sweats   . Anxiety   . Hot flashes   . PONV (postoperative nausea and vomiting)   . Arthritis     fingers, feet, elbow    ALLERGIES:  is allergic to adhesive.  MEDICATIONS:  Current Outpatient Prescriptions  Medication Sig Dispense Refill  . ALPRAZolam (XANAX) 0.5 MG tablet Take 0.5-1 mg by mouth 4 (four) times daily as needed. For anxiety/insomnia      . metoprolol succinate  (TOPROL-XL) 25 MG 24 hr tablet Take 25 mg by mouth at bedtime.       Marland Kitchen oxyCODONE (OXY IR/ROXICODONE) 5 MG immediate release tablet Take 1-2 tablets (5-10 mg total) by mouth every 4 (four) hours as needed for pain.  40 tablet  0  . vitamin B-12 (CYANOCOBALAMIN) 1000 MCG tablet Take 1,000 mcg by mouth daily.      . ondansetron (ZOFRAN) 4 MG tablet Take 1 tablet (4 mg total) by mouth every 4 (four) hours as needed for nausea.  20 tablet  0   No current facility-administered medications for this visit.   Facility-Administered Medications Ordered in Other Visits  Medication Dose Route Frequency Provider Last Rate Last Dose  . sodium chloride 0.9 % injection 10 mL  10 mL Intracatheter PRN Victorino December, MD   10 mL at 09/02/12 1619    SURGICAL HISTORY:  Past Surgical History  Procedure Date  . Broken arm   . Broken wrist   . Dilation and curettage of uterus   . Portacath placement 06/29/2012    Procedure: INSERTION PORT-A-CATH;  Surgeon: Adolph Pollack, MD;  Location: [facility];  Service: General;  Laterality: N/A;   ultrasound guided Port-A-Cath insertion  . External ear surgery     at early age ears pinned back  . Total mastectomy 11/17/2012    Procedure: TOTAL MASTECTOMY;  Surgeon: Adolph Pollack, MD;  Location: [facility] OR;  Service: General;  Laterality: Left;  Marland Kitchen Mastectomy with axillary lymph node dissection 11/17/2012    Procedure: MASTECTOMY WITH AXILLARY LYMPH NODE DISSECTION;  Surgeon: Adolph Pollack, MD;  Location: [facility] OR;  Service: General;  Laterality: Right;    REVIEW OF SYSTEMS:   General: fatigue (+), night sweats (-), fever (-), pain (-) Lymph: palpable nodes (-) HEENT: vision changes (-), mucositis (-), gum bleeding (-), epistaxis (-) Cardiovascular: chest pain (-), palpitations (-) Pulmonary: shortness of breath (-), dyspnea on exertion (-), cough (-), hemoptysis (-) GI:  Early satiety (-), melena (-), dysphagia (-), nausea/vomiting (-), diarrhea (-) GU:  dysuria (-), hematuria (-), incontinence (-) Musculoskeletal: joint swelling (-), joint pain (-), back pain (-) Neuro: weakness (-), numbness (-), headache (-), confusion (-) Skin: Rash (-), lesions (-), dryness (-) Psych: depression (-), suicidal/homicidal ideation (-), feeling of hopelessness (-)   PHYSICAL EXAMINATION:  BP 103/62  Pulse 60  Temp 98.4 F (36.9 C) (Oral)  Resp 18  Ht 5\' 3"  (1.6 m)  Wt 133 lb 4.8 oz (60.464 kg)  BMI 23.61 kg/m2 General: Patient is a well appearing female in no acute distress HEENT: PERRLA, sclerae anicteric no conjunctival pallor, MMM Neck: supple, no palpable adenopathy Lungs: clear to auscultation bilaterally, no wheezes, rhonchi, or rales Cardiovascular: regular rate rhythm, S1, S2, no murmurs, rubs or gallops Abdomen: Soft, non-tender, non-distended, normoactive bowel sounds, no HSM Extremities: warm and well perfused, no clubbing, cyanosis, 1+ edema in RLE, 2+ edema in LLE Skin: No rashes or lesions Neuro: Non-focal Bilateral mastectomy scars are healing  ECOG PERFORMANCE STATUS: 0 - Asymptomatic    LABORATORY DATA: Lab Results  Component Value Date   WBC 7.0 11/11/2012   HGB 11.8* 11/11/2012   HCT 36.6 11/11/2012   MCV 95.1 11/11/2012   PLT 265 11/11/2012      Chemistry      Component Value Date/Time   NA 140 11/11/2012 1225   NA 140 11/09/2012 1017   K 4.1 11/11/2012 1225   K 4.3 11/09/2012 1017   CL 105 11/11/2012 1225   CL 110* 11/09/2012 1017   CO2 24 11/11/2012 1225   CO2 23 11/09/2012 1017   BUN 10 11/11/2012 1225   BUN 13.0 11/09/2012 1017   CREATININE 0.60 11/11/2012 1225   CREATININE 0.7 11/09/2012 1017      Component Value Date/Time   CALCIUM 9.4 11/11/2012 1225   CALCIUM 9.2 11/09/2012 1017   ALKPHOS 86 11/11/2012 1225   ALKPHOS 87 11/09/2012 1017   AST 24 11/11/2012 1225   AST 18 11/09/2012 1017   ALT 21 11/11/2012 1225   ALT 16 11/09/2012 1017   BILITOT 0.3 11/11/2012 1225   BILITOT 0.41 11/09/2012 1017     Diagnosis 1.  Breast, simple mastectomy, Left - BREAST PARENCHYMA WITH NEOADJUVANT CHANGES AND ATYPICAL LOBULAR HYPERPLASIA. - BENIGN DUCTS WITH ASSOCIATED CALCIFICATION. - SEE COMMENT. 2. Breast, modified radical mastectomy , Right and Axillary Contents - MULTIFOCAL INVASIVE GRADE II LOBULAR CARCINOMA, LARGEST FOCUS SPANNING 1.5 CM IN GREATEST DIMENSION WITH ASSOCIATED ATYPICAL LOBULAR HYPERPLASIA. - TUMOR IS 0.1 CM FROM POSTERIOR DEEP MARGIN IN THE UPPER OUTER QUADRANT AND 0.15 CM FROM THE POSTERIOR DEEP MARGIN IN THE LOWER OUTER QUADRANT. - OTHER MARGINS ARE NEGATIVE. - FOUR BENIGN LYMPH NODES WITH NO TUMOR SEEN (0/4). - SEE ONCOLOGY TEMPLATE. Microscopic Comment 1. An E-cadherin immunohistochemical stain is performed on a single block in the left simple mastectomy which is negative between individual cells in a proliferative focus morphologically consistent with atypical lobular hyperplasia, confirming its presence. 2. BREAST, INVASIVE TUMOR, WITH LYMPH NODE SAMPLING Specimen, including laterality: Right breast and axillary contents.  Procedure: Right modified radical mastectomy with axillary contents. Grade: II. Tubule formation: 3. Nuclear pleomorphism: 2. Mitotic: 1. Tumor size (gross measurement): Largest focus measures 1.5 cm. Margins: Invasive, distance to closest margin: Invasive tumor is 0.1 cm from the deep posterior margin in the upper outer quadrant and 0.15 cm from the posterior margin in the lower outer quadrant. 1 of 3 FINAL for Cassie Carney, Cassie Carney ([id number]) Microscopic Comment(continued) Lymphovascular invasion: Although definitive lymphovascular invasion is not identified in the current specimen, a previous axillary lymph node was reported as positive (see previous case [phone]). Ductal carcinoma in situ: Not identified. Lobular neoplasia: Yes, atypical lobular hyperplasia present. Tumor focality: Multifocal. Treatment effect: Neoadjuvant change is identified within the  breast parenchyma; however, multifocal invasive lobular carcinoma is present. As the lymph nodes present within the specimen do not show evidence of metastatic tumor and the previous case showed metastatic tumor in the lymph nodes ([id number]), there is a decrease in the nodal stage status post treatment. Therefore, the findings represent a partial response (PR). Extent of tumor: Tumor confined to breast parenchyma. Lymph nodes: # examined: 4. Lymph nodes with metastasis: 0. Breast prognostic profile: Performed on previous case [phone]. Estrogen receptor: 100%, positive. Progesterone receptor: 99%, positive. HER-2/neu by CISH: 0.71, no amplification. Ki-67: 18%, low. Non-neoplastic breast: Neoadjuvant changes. Benign ducts with associated calcification. TNM: ympT1c, ypN0, MX, see comments. Comments: Of note, no carcinoma is identified within the lymph nodes found in the specimen either by morphology or immunohistochemical staining. A cytokeratin AE1/AE3 stain is performed on all lymph node tissue (two stains total), which fails to demonstrate evidence of metastatic carcinoma. Additional search for lymph nodes is performed through use of clearing solution with no additional lymph nodes identified. A cytokeratin AE1/AE3 immunohistochemical stain is also performed on three additional blocks of the mastectomy specimen to help visualize the relationship of the invasive lobular carcinoma to the margin. A HER-2/neu by CISH will be repeated on the current tumor and reported in an addendum. (RAH:eps 11/19/12)  RADIOGRAPHIC STUDIES:   ASSESSMENT: 62 year old female with  #1Stage II a (T2 N0) invasive lobular carcinoma of the right breast. Patient is s/p neoadjuvant chemotherapy consisting of Taxotere and Cytoxan administered from 07/01/12 - 10/15/12  #2. Patient is now status post bilateral mastectomies performed on 11/17/1998 413. The final pathology revealed on the right 1.5 cm invasive  grade 2 lobular carcinoma 4 sentinel nodes were negative for metastatic disease tumor was ER positive PR positive HER-2/neu negative. On the left side simple mastectomy was performed which did not reveal any malignancy but did show atypical lobular hyperplasia.  #3 patient will now be referred to radiation oncology for consultation  PLAN:   #1 refer to radiation oncology.  #2 I will plan on seeing the patient back in one month's time at which time if she does not get radiation we will plan on getting her started on tamoxifen 20 mg on a daily basis for a total of 10 years.  All questions were answered. The patient knows to call the clinic with any problems, questions or concerns. We can certainly see the patient much sooner if necessary.  I spent 25 minutes counseling the patient face to face. The total time spent in the appointment was 30 minutes.  Drue Second, MD Medical/Oncology [facility] [phone] (beeper) [phone] (Office)  11/24/2012, 10:11 AM

## 2012-11-24 NOTE — Telephone Encounter (Signed)
gv pt appt schedule for March. °

## 2012-11-25 ENCOUNTER — Telehealth: Payer: Self-pay | Admitting: *Deleted

## 2012-11-25 NOTE — Telephone Encounter (Signed)
Returned vm from pt. She is inquiring as to purpose of her Rad Onc appts on 12/03/12. Informed her she has consult w/Dr Michell Heinrich to discuss radiation tx details, # of treatments. She will have opportunity to ask questions. She understands to arrive 30 min prior to scheduled nurse eval appt for registration process. All of pt's questions answered; she verbalized understanding.

## 2012-11-26 ENCOUNTER — Ambulatory Visit (INDEPENDENT_AMBULATORY_CARE_PROVIDER_SITE_OTHER): Payer: BC Managed Care – PPO

## 2012-11-26 DIAGNOSIS — Z4803 Encounter for change or removal of drains: Secondary | ICD-10-CM

## 2012-11-26 DIAGNOSIS — Z4889 Encounter for other specified surgical aftercare: Secondary | ICD-10-CM

## 2012-11-26 NOTE — Patient Instructions (Signed)
Pt is po bilateral mastectomies.  She is here today for removal of her drain.  Opening covered with clean, dry gauze.  Pt will return for her scheduled f/u with Dr. Abbey Chatters.

## 2012-11-30 ENCOUNTER — Encounter (INDEPENDENT_AMBULATORY_CARE_PROVIDER_SITE_OTHER): Payer: BC Managed Care – PPO | Admitting: General Surgery

## 2012-12-02 ENCOUNTER — Encounter (INDEPENDENT_AMBULATORY_CARE_PROVIDER_SITE_OTHER): Payer: BC Managed Care – PPO | Admitting: General Surgery

## 2012-12-02 ENCOUNTER — Encounter: Payer: Self-pay | Admitting: *Deleted

## 2012-12-02 DIAGNOSIS — Z9221 Personal history of antineoplastic chemotherapy: Secondary | ICD-10-CM | POA: Insufficient documentation

## 2012-12-02 DIAGNOSIS — F419 Anxiety disorder, unspecified: Secondary | ICD-10-CM | POA: Insufficient documentation

## 2012-12-02 DIAGNOSIS — M1990 Unspecified osteoarthritis, unspecified site: Secondary | ICD-10-CM | POA: Insufficient documentation

## 2012-12-03 ENCOUNTER — Ambulatory Visit: Admission: RE | Admit: 2012-12-03 | Payer: BC Managed Care – PPO | Source: Ambulatory Visit

## 2012-12-03 ENCOUNTER — Ambulatory Visit: Payer: BC Managed Care – PPO | Admitting: Radiation Oncology

## 2012-12-03 ENCOUNTER — Telehealth: Payer: Self-pay | Admitting: *Deleted

## 2012-12-03 NOTE — Telephone Encounter (Signed)
Called patient home  Spoke with spouse , they cancelled appt today last night,left message with operator, patient has the pink eye and they are unable to get out of their driveway in [place], they will call back and reschedule at a later time, thanked this RN for call ,will inform MD 9:24 AM

## 2012-12-03 NOTE — Telephone Encounter (Signed)
CALLED PATIENT TO INFORM THAT APPT. HAS BEEN MOVED TO 12-08-12- ARRIVAL TIME - 12:30 PM, SPOKE WITH PATIENT AND SHE IS AWARE OF APPT. CHANGE AND O.K. WITH IT.

## 2012-12-05 ENCOUNTER — Other Ambulatory Visit: Payer: Self-pay

## 2012-12-08 ENCOUNTER — Ambulatory Visit
Admission: RE | Admit: 2012-12-08 | Discharge: 2012-12-08 | Disposition: A | Discharge status: Home / Self Care | Payer: BC Managed Care – PPO | Source: Ambulatory Visit | Attending: Radiation Oncology | Admitting: Radiation Oncology

## 2012-12-08 ENCOUNTER — Encounter: Payer: Self-pay | Admitting: Radiation Oncology

## 2012-12-08 VITALS — BP 114/65 | HR 62 | Temp 97.9°F | Resp 20 | Wt 129.8 lb

## 2012-12-08 DIAGNOSIS — L819 Disorder of pigmentation, unspecified: Secondary | ICD-10-CM | POA: Insufficient documentation

## 2012-12-08 DIAGNOSIS — C50419 Malignant neoplasm of upper-outer quadrant of unspecified female breast: Secondary | ICD-10-CM

## 2012-12-08 DIAGNOSIS — C50919 Malignant neoplasm of unspecified site of unspecified female breast: Secondary | ICD-10-CM | POA: Insufficient documentation

## 2012-12-08 DIAGNOSIS — L539 Erythematous condition, unspecified: Secondary | ICD-10-CM | POA: Insufficient documentation

## 2012-12-08 DIAGNOSIS — Z510 Encounter for antineoplastic radiation therapy: Secondary | ICD-10-CM | POA: Insufficient documentation

## 2012-12-08 DIAGNOSIS — R5381 Other malaise: Secondary | ICD-10-CM | POA: Insufficient documentation

## 2012-12-08 NOTE — Progress Notes (Signed)
Pt denies pain, fatigue, loss of appetite. She has slight numbness/tingling in fingers and feet.  Husband w/pt today.

## 2012-12-08 NOTE — Progress Notes (Signed)
   Department of Radiation Oncology  Phone:  [phone] Fax:        [phone]   Name: Cassie Carney MRN: [id number]  DOB: 05-08-1951  Date: 12/08/2012  Follow Up Visit Note  Diagnosis: T2N1 right breast cancer  Interval History: Cassie Carney presents today for routine followup.  I last saw her in multidisciplinary conference. He was noted to have an invasive lobular carcinoma of the left breast. An upfront sentinel lymph node biopsy was recommended and performed. This unfortunately showed cancer in 2 out of 2 lymph nodes. She underwent neoadjuvant chemotherapy and underwent bilateral mastectomies on January 28. This revealed a 1.5 cm grade 2 lobular carcinoma on the right was zero out of four sentinel lymph nodes positive this tumor was ER/PR positive HER-2 negative. The margin was 0.1 cm to the deep margin in the upper outer quadrant and 0.15 cm in the lower outer quadrant.. The left side did not show any malignancy but did show atypical lobular hyperplasia. She is referred for consideration of radiation given her initial 2 positive lymph nodes.  Allergies:  Allergies  Allergen Reactions  . Adhesive (Tape)     Rash    Medications:  Current Outpatient Prescriptions  Medication Sig Dispense Refill  . ALPRAZolam (XANAX) 0.5 MG tablet Take 0.5-1 mg by mouth 4 (four) times daily as needed. For anxiety/insomnia      . metoprolol succinate (TOPROL-XL) 25 MG 24 hr tablet Take 25 mg by mouth at bedtime.       . vitamin B-12 (CYANOCOBALAMIN) 1000 MCG tablet Take 1,000 mcg by mouth daily.      Marland Kitchen tobramycin-dexamethasone (TOBRADEX) ophthalmic solution        No current facility-administered medications for this encounter.   Facility-Administered Medications Ordered in Other Encounters  Medication Dose Route Frequency Provider Last Rate Last Dose  . sodium chloride 0.9 % injection 10 mL  10 mL Intracatheter PRN Victorino December, MD   10 mL at 09/02/12 1619    Physical Exam:  Filed Vitals:   12/08/12 1247  BP: 114/65  Pulse: 62  Temp: 97.9 F (36.6 C)  Resp: 20   she is a pleasant female in no distress sitting comfortably examining table. She has bilateral mastectomy scars which are healing well.  IMPRESSION: Cassie Carney is a 62 y.o. female status post neoadjuvant chemotherapy followed by bilateral mastectomies with 2 initial sentinel lymph nodes positive, and close margins after chemotherapy  PLAN:  I spoke to Su and her husband today regarding the role of radiation in decreasing local failure. We discussed the process of simulation the placement tattoos. We discussed treating her chest wall and supraclavicular lymph nodes. I will try to treat at least the low axilla given her initial 2 positive lymph nodes although an additional 4 lymph nodes after neoadjuvant chemotherapy were negative. We discussed the possible side effects of treatment including skin redness and fatigue. We discussed the possibility of lung heart rib damage. We discussed complications associated with reconstruction and the need for autologous to reconstruction. She is not interested in reconstruction at this time she states. She has appointment with Dr. Abbey Chatters on the 20th. I will schedule her for her radiation to begin next week unless she hears from Dr. Abbey Chatters that she needs to be delayed.    Lurline Hare, MD

## 2012-12-08 NOTE — Progress Notes (Signed)
Please see the Nurse Progress Note in the MD Initial Consult Encounter for this patient. 

## 2012-12-08 NOTE — Progress Notes (Signed)
Name: [name]   MRN: [id number]  Date:  12/08/2012  DOB: 1951/02/08  Status:outpatient    DIAGNOSIS: Breast cancer.  CONSENT VERIFIED: yes   SET UP: Patient is setup supine   IMMOBILIZATION:  The following immobilization was used:Custom Moldable Pillow, breast board.   NARRATIVE: Cassie Carney was brought to the CT Simulation planning suite.  Identity was confirmed.  All relevant records and images related to the planned course of therapy were reviewed.  Then, the patient was positioned in a stable reproducible clinical set-up for radiation therapy.  Wires were placed to delineate the clinical extent of breast tissue. A wire was placed on the scar as well.  CT images were obtained.  An isocenter was placed. Skin markings were placed.  The CT images were loaded into the planning software where the target and avoidance structures were contoured.  The radiation prescription was entered and confirmed. The patient was discharged in stable condition and tolerated simulation well.    TREATMENT PLANNING NOTE:  Treatment planning then occurred. I have requested : MLC's, isodose plan, basic dose calculation  I personally designed and supervised the construction of 4 medically necessary complex treatment devices for the protection of critical normal structures including the lungs and contralateral breast as well as the immobilization device which is necessary for set up certainty.

## 2012-12-09 NOTE — Addendum Note (Signed)
Encounter addended by: Glennie Hawk, RN on: 12/09/2012  7:58 AM<BR>     Documentation filed: Charges VN

## 2012-12-10 ENCOUNTER — Ambulatory Visit (INDEPENDENT_AMBULATORY_CARE_PROVIDER_SITE_OTHER): Payer: BC Managed Care – PPO | Admitting: General Surgery

## 2012-12-10 VITALS — BP 118/62 | HR 60 | Temp 97.5°F | Resp 16 | Ht 63.0 in | Wt 127.4 lb

## 2012-12-10 DIAGNOSIS — Z9889 Other specified postprocedural states: Secondary | ICD-10-CM

## 2012-12-10 NOTE — Progress Notes (Signed)
Procedure:  Right modified radical mastectomy. Left total mastectomy.  Date:  11/17/2038  Pathology: Right T2N1.  Left benign  History:  She presents for her second postoperative visit. We discussed the pathology. The remaining drain was removed. She has good range of motion. She is due to start radiation next week.  Exam: General- Is in NAD. Chest wall-both incisions are clean and intact.   Assessment:  T2N1 Right breast cancer status post right modified radical mastectomy and left total mastectomy-wounds are healing well.  Plan:  She may proceed with radiation from my standpoint. Return visit 3 months at which time we will talk about Port-A-Cath removal.

## 2012-12-10 NOTE — Patient Instructions (Signed)
We will talk about Port-A-Cath removal during the next visit.

## 2012-12-15 ENCOUNTER — Ambulatory Visit
Admission: RE | Admit: 2012-12-15 | Discharge: 2012-12-15 | Disposition: A | Discharge status: Home / Self Care | Payer: BC Managed Care – PPO | Source: Ambulatory Visit | Attending: Radiation Oncology | Admitting: Radiation Oncology

## 2012-12-15 ENCOUNTER — Ambulatory Visit: Payer: BC Managed Care – PPO

## 2012-12-15 DIAGNOSIS — C50411 Malignant neoplasm of upper-outer quadrant of right female breast: Secondary | ICD-10-CM

## 2012-12-15 NOTE — Progress Notes (Signed)
  Radiation Oncology         [phone]) [phone] ________________________________  Name: Cassie Carney MRN: [id number]  Date: 12/15/2012  DOB: 02-26-1951  Simulation Verification Note  Status: outpatient  NARRATIVE: The patient was brought to the treatment unit and placed in the planned treatment position. The clinical setup was verified. Then port films were obtained and uploaded to the radiation oncology medical record software.  The treatment beams were carefully compared against the planned radiation fields. The position location and shape of the radiation fields was reviewed. The targeted volume of tissue appears appropriately covered by the radiation beams. Organs at risk appear to be excluded as planned.  Based on my personal review, I approved the simulation verification. The patient's treatment will proceed as planned.  ------------------------------------------------  Lurline Hare, MD

## 2012-12-16 ENCOUNTER — Ambulatory Visit
Admission: RE | Admit: 2012-12-16 | Discharge: 2012-12-16 | Disposition: A | Discharge status: Home / Self Care | Payer: BC Managed Care – PPO | Source: Ambulatory Visit | Attending: Radiation Oncology | Admitting: Radiation Oncology

## 2012-12-16 ENCOUNTER — Ambulatory Visit: Payer: BC Managed Care – PPO

## 2012-12-16 DIAGNOSIS — C50411 Malignant neoplasm of upper-outer quadrant of right female breast: Secondary | ICD-10-CM

## 2012-12-16 MED ORDER — RADIAPLEXRX EX GEL
Freq: Two times a day (BID) | CUTANEOUS | Status: DC
  Administered 2012-12-16: 15:00:00 via TOPICAL

## 2012-12-16 MED ORDER — ALRA NON-METALLIC DEODORANT (RAD-ONC)
1.0000 "application " | Freq: Once | TOPICAL | Status: AC
  Administered 2012-12-16: 1 via TOPICAL

## 2012-12-16 NOTE — Progress Notes (Signed)
Post sim ed completed w/pt. Gave pt "Radiaiotn and You" booklet w/all pertinent information marked and discussed, re: fatigue, skin irritation/care, nutrition, pain. Gave pt Radiaplex, Alra w/instructions for proper use. Pt verbalized understanding.

## 2012-12-16 NOTE — Addendum Note (Signed)
Encounter addended by: Glennie Hawk, RN on: 12/16/2012  2:44 PM<BR>     Documentation filed: Inpatient MAR, Orders

## 2012-12-17 ENCOUNTER — Ambulatory Visit
Admission: RE | Admit: 2012-12-17 | Discharge: 2012-12-17 | Disposition: A | Discharge status: Home / Self Care | Payer: BC Managed Care – PPO | Source: Ambulatory Visit | Attending: Radiation Oncology | Admitting: Radiation Oncology

## 2012-12-17 ENCOUNTER — Ambulatory Visit: Payer: BC Managed Care – PPO

## 2012-12-18 ENCOUNTER — Ambulatory Visit: Payer: BC Managed Care – PPO

## 2012-12-18 ENCOUNTER — Ambulatory Visit
Admission: RE | Admit: 2012-12-18 | Discharge: 2012-12-18 | Disposition: A | Discharge status: Home / Self Care | Payer: BC Managed Care – PPO | Source: Ambulatory Visit | Attending: Radiation Oncology | Admitting: Radiation Oncology

## 2012-12-21 ENCOUNTER — Ambulatory Visit
Admission: RE | Admit: 2012-12-21 | Discharge: 2012-12-21 | Disposition: A | Discharge status: Home / Self Care | Payer: BC Managed Care – PPO | Source: Ambulatory Visit | Attending: Radiation Oncology | Admitting: Radiation Oncology

## 2012-12-21 ENCOUNTER — Ambulatory Visit: Payer: BC Managed Care – PPO

## 2012-12-22 ENCOUNTER — Ambulatory Visit
Admission: RE | Admit: 2012-12-22 | Discharge: 2012-12-22 | Disposition: A | Discharge status: Home / Self Care | Payer: BC Managed Care – PPO | Source: Ambulatory Visit | Attending: Radiation Oncology | Admitting: Radiation Oncology

## 2012-12-22 ENCOUNTER — Ambulatory Visit: Payer: BC Managed Care – PPO

## 2012-12-22 ENCOUNTER — Encounter: Payer: Self-pay | Admitting: Radiation Oncology

## 2012-12-22 VITALS — BP 111/68 | HR 60 | Temp 97.7°F | Resp 20 | Wt 131.3 lb

## 2012-12-22 DIAGNOSIS — C50411 Malignant neoplasm of upper-outer quadrant of right female breast: Secondary | ICD-10-CM

## 2012-12-22 NOTE — Progress Notes (Signed)
Pt denies pain, fatigue. She is applying Radiaplex to right breast tx area.

## 2012-12-22 NOTE — Progress Notes (Signed)
Weekly Management Note Current Dose: 9  Gy  Projected Dose:60.4  Gy   Narrative:  The patient presents for routine under treatment assessment.  CBCT/MVCT images/Port film x-rays were reviewed.  The chart was checked. Doing well. No complaints.   Physical Findings: Weight: 131 lb 4.8 oz (59.557 kg). Unchanged  Impression:  The patient is tolerating radiation.  Plan:  Continue treatment as planned. Continue radiaplex.

## 2012-12-23 ENCOUNTER — Ambulatory Visit
Admission: RE | Admit: 2012-12-23 | Discharge: 2012-12-23 | Disposition: A | Discharge status: Home / Self Care | Payer: BC Managed Care – PPO | Source: Ambulatory Visit | Attending: Radiation Oncology | Admitting: Radiation Oncology

## 2012-12-23 ENCOUNTER — Ambulatory Visit: Payer: BC Managed Care – PPO

## 2012-12-24 ENCOUNTER — Ambulatory Visit
Admission: RE | Admit: 2012-12-24 | Discharge: 2012-12-24 | Disposition: A | Discharge status: Home / Self Care | Payer: BC Managed Care – PPO | Source: Ambulatory Visit | Attending: Radiation Oncology | Admitting: Radiation Oncology

## 2012-12-24 ENCOUNTER — Ambulatory Visit: Payer: BC Managed Care – PPO

## 2012-12-25 ENCOUNTER — Encounter: Payer: Self-pay | Admitting: Oncology

## 2012-12-25 ENCOUNTER — Telehealth: Payer: Self-pay | Admitting: Oncology

## 2012-12-25 ENCOUNTER — Other Ambulatory Visit (HOSPITAL_BASED_OUTPATIENT_CLINIC_OR_DEPARTMENT_OTHER): Payer: BC Managed Care – PPO | Admitting: Lab

## 2012-12-25 ENCOUNTER — Ambulatory Visit: Payer: BC Managed Care – PPO

## 2012-12-25 ENCOUNTER — Ambulatory Visit (HOSPITAL_BASED_OUTPATIENT_CLINIC_OR_DEPARTMENT_OTHER): Payer: BC Managed Care – PPO | Admitting: Oncology

## 2012-12-25 ENCOUNTER — Ambulatory Visit
Admission: RE | Admit: 2012-12-25 | Discharge: 2012-12-25 | Disposition: A | Discharge status: Home / Self Care | Payer: BC Managed Care – PPO | Source: Ambulatory Visit | Attending: Radiation Oncology | Admitting: Radiation Oncology

## 2012-12-25 VITALS — BP 112/79 | HR 64 | Temp 97.9°F | Wt 127.6 lb

## 2012-12-25 DIAGNOSIS — C50419 Malignant neoplasm of upper-outer quadrant of unspecified female breast: Secondary | ICD-10-CM

## 2012-12-25 DIAGNOSIS — C50411 Malignant neoplasm of upper-outer quadrant of right female breast: Secondary | ICD-10-CM

## 2012-12-25 DIAGNOSIS — Z170 Estrogen receptor positive status [ER+]: Secondary | ICD-10-CM

## 2012-12-25 LAB — CBC WITH DIFFERENTIAL/PLATELET
BASO%: 0.4 % (ref 0.0–2.0)
MCHC: 33.4 g/dL (ref 31.5–36.0)
MONO#: 0.5 10*3/uL (ref 0.1–0.9)
RBC: 4.52 10*6/uL (ref 3.70–5.45)
WBC: 4.2 10*3/uL (ref 3.9–10.3)
lymph#: 0.7 10*3/uL — ABNORMAL LOW (ref 0.9–3.3)

## 2012-12-25 LAB — COMPREHENSIVE METABOLIC PANEL (CC13)
ALT: 25 U/L (ref 0–55)
Alkaline Phosphatase: 104 U/L (ref 40–150)
Sodium: 141 mEq/L (ref 136–145)
Total Bilirubin: 0.44 mg/dL (ref 0.20–1.20)
Total Protein: 6.8 g/dL (ref 6.4–8.3)

## 2012-12-25 NOTE — Progress Notes (Signed)
OFFICE PROGRESS NOTE  CC  Cassie Moron, MD [address] [place] [address] Dr. Avel Peace Dr. Lurline Hare   DIAGNOSIS: 62 year old female with invasive lobular carcinoma of the right breast.  PRIOR THERAPY:  #1 patient was originally seen in the multidisciplinary breast clinic on a 8/28/ 2013 for a stage II a invasive lobular carcinoma that was ER positive.  #2 at the time it was recommended patient proceed with neoadjuvant chemotherapy. But did want A. Upfront sentinel lymph node biopsy and a Port-A-Cath placed.   #3she underwent treatment with neoadjuvant Taxotere and Cytoxan starting on 07/01/2012 through 10/15/2012. She completed a total of 6 cycles  #4Patient is now status post bilateral mastectomies performed on 11/17/1998 413. The final pathology revealed on the right 1.5 cm invasive grade 2 lobular carcinoma 4 sentinel nodes were negative for metastatic disease tumor was ER positive PR positive HER-2/neu negative. On the left side simple mastectomy was performed which did not reveal any malignancy but did show atypical lobular hyperplasia.  #5 patient was referred to radiation oncology and she has begun radiation therapy to the right breast.  CURRENT THERAPY:  Patient is getting radiation. INTERVAL HISTORY: Cassie Carney 62 y.o. female returns for follow up.overall she's doing well. She is continuing radiation therapy will not be completed total sometime in April. She does have some fatigue. She continues to work. She denies any fevers chills night sweats headaches she has no shortness of breath chest pains or palpitations no myalgias and arthralgias. Remainder of the 10 point review of systems is negative.  MEDICAL HISTORY: Past Medical History  Diagnosis Date  . Mitral valve prolapse   . Night sweats   . Hot flashes   . PONV (postoperative nausea and vomiting)   . Anxiety   . Arthritis     fingers, feet, elbow  . History of chemotherapy  07/01/12 -10/15/12    6 cycles  . Breast cancer 06/08/12    right, ER/PR +, Her 2 -    ALLERGIES:  is allergic to adhesive.  MEDICATIONS:  Current Outpatient Prescriptions  Medication Sig Dispense Refill  . ALPRAZolam (XANAX) 0.5 MG tablet Take 0.5-1 mg by mouth 4 (four) times daily as needed. For anxiety/insomnia      . Biotin 1 MG CAPS Take by mouth daily.      . hyaluronate sodium (RADIAPLEXRX) GEL Apply topically 2 (two) times daily.      . metoprolol succinate (TOPROL-XL) 25 MG 24 hr tablet Take 25 mg by mouth at bedtime.       . non-metallic deodorant Thornton Papas) MISC Apply 1 application topically daily as needed.      . vitamin B-12 (CYANOCOBALAMIN) 1000 MCG tablet Take 1,000 mcg by mouth daily.       No current facility-administered medications for this visit.   Facility-Administered Medications Ordered in Other Visits  Medication Dose Route Frequency Provider Last Rate Last Dose  . sodium chloride 0.9 % injection 10 mL  10 mL Intracatheter PRN Victorino December, MD   10 mL at 09/02/12 1619    SURGICAL HISTORY:  Past Surgical History  Procedure Laterality Date  . Broken arm    . Broken wrist    . Dilation and curettage of uterus    . Portacath placement  06/29/2012    Procedure: INSERTION PORT-A-CATH;  Surgeon: Adolph Pollack, MD;  Location: [facility];  Service: General;  Laterality: N/A;   ultrasound guided Port-A-Cath insertion  .  External ear surgery      at early age ears pinned back  . Total mastectomy  11/17/2012    Procedure: R TOTAL MASTECTOMY;  Surgeon: Adolph Pollack, MD;  Location: [facility] OR;  Service: General;  Laterality: Left;  Marland Kitchen Mastectomy with axillary lymph node dissection  11/17/2012    Procedure: MASTECTOMY WITH AXILLARY LYMPH NODE DISSECTION;  Surgeon: Adolph Pollack, MD;  Location: [facility] OR;  Service: General;  Laterality: Right;  . Simple mastectomy  11/17/12    left-benign    REVIEW OF SYSTEMS:   General: fatigue (+), night sweats (-),  fever (-), pain (-) Lymph: palpable nodes (-) HEENT: vision changes (-), mucositis (-), gum bleeding (-), epistaxis (-) Cardiovascular: chest pain (-), palpitations (-) Pulmonary: shortness of breath (-), dyspnea on exertion (-), cough (-), hemoptysis (-) GI:  Early satiety (-), melena (-), dysphagia (-), nausea/vomiting (-), diarrhea (-) GU: dysuria (-), hematuria (-), incontinence (-) Musculoskeletal: joint swelling (-), joint pain (-), back pain (-) Neuro: weakness (-), numbness (-), headache (-), confusion (-) Skin: Rash (-), lesions (-), dryness (-) Psych: depression (-), suicidal/homicidal ideation (-), feeling of hopelessness (-)   PHYSICAL EXAMINATION:  BP 112/79  Pulse 64  Temp(Src) 97.9 F (36.6 C) (Oral)  Wt 127 lb 9.6 oz (57.879 kg)  BMI 22.61 kg/m2 General: Patient is a well appearing female in no acute distress HEENT: PERRLA, sclerae anicteric no conjunctival pallor, MMM Neck: supple, no palpable adenopathy Lungs: clear to auscultation bilaterally, no wheezes, rhonchi, or rales Cardiovascular: regular rate rhythm, S1, S2, no murmurs, rubs or gallops Abdomen: Soft, non-tender, non-distended, normoactive bowel sounds, no HSM Extremities: warm and well perfused, no clubbing, cyanosis, 1+ edema in RLE, 2+ edema in LLE Skin: No rashes or lesions Neuro: Non-focal Bilateral mastectomy scars are healing  ECOG PERFORMANCE STATUS: 0 - Asymptomatic    LABORATORY DATA: Lab Results  Component Value Date   WBC 4.2 12/25/2012   HGB 13.6 12/25/2012   HCT 40.8 12/25/2012   MCV 90.2 12/25/2012   PLT 212 12/25/2012      Chemistry      Component Value Date/Time   NA 140 11/11/2012 1225   NA 140 11/09/2012 1017   K 4.1 11/11/2012 1225   K 4.3 11/09/2012 1017   CL 105 11/11/2012 1225   CL 110* 11/09/2012 1017   CO2 24 11/11/2012 1225   CO2 23 11/09/2012 1017   BUN 10 11/11/2012 1225   BUN 13.0 11/09/2012 1017   CREATININE 0.60 11/11/2012 1225   CREATININE 0.7 11/09/2012 1017       Component Value Date/Time   CALCIUM 9.4 11/11/2012 1225   CALCIUM 9.2 11/09/2012 1017   ALKPHOS 86 11/11/2012 1225   ALKPHOS 87 11/09/2012 1017   AST 24 11/11/2012 1225   AST 18 11/09/2012 1017   ALT 21 11/11/2012 1225   ALT 16 11/09/2012 1017   BILITOT 0.3 11/11/2012 1225   BILITOT 0.41 11/09/2012 1017     Diagnosis 1. Breast, simple mastectomy, Left - BREAST PARENCHYMA WITH NEOADJUVANT CHANGES AND ATYPICAL LOBULAR HYPERPLASIA. - BENIGN DUCTS WITH ASSOCIATED CALCIFICATION. - SEE COMMENT. 2. Breast, modified radical mastectomy , Right and Axillary Contents - MULTIFOCAL INVASIVE GRADE II LOBULAR CARCINOMA, LARGEST FOCUS SPANNING 1.5 CM IN GREATEST DIMENSION WITH ASSOCIATED ATYPICAL LOBULAR HYPERPLASIA. - TUMOR IS 0.1 CM FROM POSTERIOR DEEP MARGIN IN THE UPPER OUTER QUADRANT AND 0.15 CM FROM THE POSTERIOR DEEP MARGIN IN THE LOWER OUTER QUADRANT. - OTHER MARGINS ARE NEGATIVE. - FOUR  BENIGN LYMPH NODES WITH NO TUMOR SEEN (0/4). - SEE ONCOLOGY TEMPLATE. Microscopic Comment 1. An E-cadherin immunohistochemical stain is performed on a single block in the left simple mastectomy which is negative between individual cells in a proliferative focus morphologically consistent with atypical lobular hyperplasia, confirming its presence. 2. BREAST, INVASIVE TUMOR, WITH LYMPH NODE SAMPLING Specimen, including laterality: Right breast and axillary contents. Procedure: Right modified radical mastectomy with axillary contents. Grade: II. Tubule formation: 3. Nuclear pleomorphism: 2. Mitotic: 1. Tumor size (gross measurement): Largest focus measures 1.5 cm. Margins: Invasive, distance to closest margin: Invasive tumor is 0.1 cm from the deep posterior margin in the upper outer quadrant and 0.15 cm from the posterior margin in the lower outer quadrant. 1 of 3 FINAL for Cassie Carney, Cassie Carney ([id number]) Microscopic Comment(continued) Lymphovascular invasion: Although definitive lymphovascular invasion is  not identified in the current specimen, a previous axillary lymph node was reported as positive (see previous case [id number]). Ductal carcinoma in situ: Not identified. Lobular neoplasia: Yes, atypical lobular hyperplasia present. Tumor focality: Multifocal. Treatment effect: Neoadjuvant change is identified within the breast parenchyma; however, multifocal invasive lobular carcinoma is present. As the lymph nodes present within the specimen do not show evidence of metastatic tumor and the previous case showed metastatic tumor in the lymph nodes ([id number]), there is a decrease in the nodal stage status post treatment. Therefore, the findings represent a partial response (PR). Extent of tumor: Tumor confined to breast parenchyma. Lymph nodes: # examined: 4. Lymph nodes with metastasis: 0. Breast prognostic profile: Performed on previous case [id number]. Estrogen receptor: 100%, positive. Progesterone receptor: 99%, positive. HER-2/neu by CISH: 0.71, no amplification. Ki-67: 18%, low. Non-neoplastic breast: Neoadjuvant changes. Benign ducts with associated calcification. TNM: ympT1c, ypN0, MX, see comments. Comments: Of note, no carcinoma is identified within the lymph nodes found in the specimen either by morphology or immunohistochemical staining. A cytokeratin AE1/AE3 stain is performed on all lymph node tissue (two stains total), which fails to demonstrate evidence of metastatic carcinoma. Additional search for lymph nodes is performed through use of clearing solution with no additional lymph nodes identified. A cytokeratin AE1/AE3 immunohistochemical stain is also performed on three additional blocks of the mastectomy specimen to help visualize the relationship of the invasive lobular carcinoma to the margin. A HER-2/neu by CISH will be repeated on the current tumor and reported in an addendum. (RAH:eps 11/19/12)  RADIOGRAPHIC STUDIES:   ASSESSMENT: 62 year old female  with  #1Stage II a (T2 N0) invasive lobular carcinoma of the right breast. Patient is s/p neoadjuvant chemotherapy consisting of Taxotere and Cytoxan administered from 07/01/12 - 10/15/12  #2. Patient is now status post bilateral mastectomies performed on 11/17/1998 413. The final pathology revealed on the right 1.5 cm invasive grade 2 lobular carcinoma 4 sentinel nodes were negative for metastatic disease tumor was ER positive PR positive HER-2/neu negative. On the left side simple mastectomy was performed which did not reveal any malignancy but did show atypical lobular hyperplasia.  #3 patient is continuing radiation therapy to the right breast. She is tolerating it well so far.  PLAN:   #1 continue radiation therapy  #2 I will plan on seeing the patient once she completes the radiation. After which we will get her started on tamoxifen 20 mg daily for a total of 10 years. I explained the rationale to her.   All questions were answered. The patient knows to call the clinic with any problems, questions or concerns. We can certainly see the patient much sooner  if necessary.  I spent 25 minutes counseling the patient face to face. The total time spent in the appointment was 30 minutes.  Drue Second, MD Medical/Oncology [facility] [phone] (beeper) [phone] (Office)  12/25/2012, 12:42 PM

## 2012-12-25 NOTE — Patient Instructions (Addendum)
Continue radiation therapy   I will see you back after completion of radiation

## 2012-12-25 NOTE — Telephone Encounter (Signed)
Sent dr Welton Flakes a pof regarding this pt needing an appt time on 02/12/2013.

## 2012-12-28 ENCOUNTER — Ambulatory Visit: Payer: BC Managed Care – PPO

## 2012-12-28 ENCOUNTER — Ambulatory Visit
Admission: RE | Admit: 2012-12-28 | Discharge: 2012-12-28 | Disposition: A | Discharge status: Home / Self Care | Payer: BC Managed Care – PPO | Source: Ambulatory Visit | Attending: Radiation Oncology | Admitting: Radiation Oncology

## 2012-12-29 ENCOUNTER — Telehealth: Payer: Self-pay | Admitting: Oncology

## 2012-12-29 ENCOUNTER — Encounter: Payer: Self-pay | Admitting: Radiation Oncology

## 2012-12-29 ENCOUNTER — Ambulatory Visit
Admission: RE | Admit: 2012-12-29 | Discharge: 2012-12-29 | Disposition: A | Discharge status: Home / Self Care | Payer: BC Managed Care – PPO | Source: Ambulatory Visit | Attending: Radiation Oncology | Admitting: Radiation Oncology

## 2012-12-29 ENCOUNTER — Ambulatory Visit: Payer: BC Managed Care – PPO

## 2012-12-29 VITALS — BP 95/62 | HR 52 | Temp 98.0°F | Resp 20 | Wt 129.2 lb

## 2012-12-29 DIAGNOSIS — C50411 Malignant neoplasm of upper-outer quadrant of right female breast: Secondary | ICD-10-CM

## 2012-12-29 NOTE — Progress Notes (Signed)
Weekly Management Note Current Dose:  18 Gy  Projected Dose: 50.4 Gy   Narrative:  The patient presents for routine under treatment assessment.  CBCT/MVCT images/Port film x-rays were reviewed.  The chart was checked. Sensitivity over portacath. Using radiaplex.  Physical Findings: Weight: 129 lb 3.2 oz (58.605 kg). Unchanged  Impression:  The patient is tolerating radiation.  Plan:  Continue treatment as planned.

## 2012-12-29 NOTE — Progress Notes (Signed)
Pt denies pain; she does states she has sensitivity over her porta cath site. Applying Radiaplex to right breast treatment area. Denies fatigue, loss of appetite.

## 2012-12-29 NOTE — Telephone Encounter (Signed)
LMONVM ADVIISNG THE PT OF HER April 2014 APPTS

## 2012-12-30 ENCOUNTER — Ambulatory Visit
Admission: RE | Admit: 2012-12-30 | Discharge: 2012-12-30 | Disposition: A | Discharge status: Home / Self Care | Payer: BC Managed Care – PPO | Source: Ambulatory Visit | Attending: Radiation Oncology | Admitting: Radiation Oncology

## 2012-12-30 ENCOUNTER — Ambulatory Visit: Payer: BC Managed Care – PPO

## 2012-12-31 ENCOUNTER — Ambulatory Visit: Payer: BC Managed Care – PPO

## 2012-12-31 ENCOUNTER — Ambulatory Visit
Admission: RE | Admit: 2012-12-31 | Discharge: 2012-12-31 | Disposition: A | Discharge status: Home / Self Care | Payer: BC Managed Care – PPO | Source: Ambulatory Visit | Attending: Radiation Oncology | Admitting: Radiation Oncology

## 2013-01-01 ENCOUNTER — Ambulatory Visit: Payer: BC Managed Care – PPO

## 2013-01-01 ENCOUNTER — Ambulatory Visit
Admission: RE | Admit: 2013-01-01 | Discharge: 2013-01-01 | Disposition: A | Discharge status: Home / Self Care | Payer: BC Managed Care – PPO | Source: Ambulatory Visit | Attending: Radiation Oncology | Admitting: Radiation Oncology

## 2013-01-04 ENCOUNTER — Ambulatory Visit
Admission: RE | Admit: 2013-01-04 | Discharge: 2013-01-04 | Disposition: A | Discharge status: Home / Self Care | Payer: BC Managed Care – PPO | Source: Ambulatory Visit | Attending: Radiation Oncology | Admitting: Radiation Oncology

## 2013-01-04 ENCOUNTER — Ambulatory Visit: Payer: BC Managed Care – PPO

## 2013-01-05 ENCOUNTER — Encounter: Payer: Self-pay | Admitting: Radiation Oncology

## 2013-01-05 ENCOUNTER — Ambulatory Visit
Admission: RE | Admit: 2013-01-05 | Discharge: 2013-01-05 | Disposition: A | Discharge status: Home / Self Care | Payer: BC Managed Care – PPO | Source: Ambulatory Visit | Attending: Radiation Oncology | Admitting: Radiation Oncology

## 2013-01-05 ENCOUNTER — Ambulatory Visit: Payer: BC Managed Care – PPO

## 2013-01-05 VITALS — BP 112/59 | HR 51 | Temp 97.9°F | Resp 20 | Wt 126.9 lb

## 2013-01-05 DIAGNOSIS — C50411 Malignant neoplasm of upper-outer quadrant of right female breast: Secondary | ICD-10-CM

## 2013-01-05 NOTE — Progress Notes (Signed)
Weekly Management Note Current Dose: 27  Gy  Projected Dose: 61 Gy   Narrative:  The patient presents for routine under treatment assessment.  CBCT/MVCT images/Port film x-rays were reviewed.  The chart was checked. Doing well. Some redness on her skin. Slight fatigue. No discomfort.  Physical Findings: Weight: 126 lb 14.4 oz (57.561 kg). Right chest wall is slightly pink.   Impression:  The patient is tolerating radiation.  Plan:  Continue treatment as planned. Continue radiaplex.

## 2013-01-05 NOTE — Progress Notes (Signed)
Pt denies pain, loss of appetite, has slight fatigue. She states her right breast is "slightly pink"; she is applying Radiaplex lotion.

## 2013-01-06 ENCOUNTER — Ambulatory Visit
Admission: RE | Admit: 2013-01-06 | Discharge: 2013-01-06 | Disposition: A | Discharge status: Home / Self Care | Payer: BC Managed Care – PPO | Source: Ambulatory Visit | Attending: Radiation Oncology | Admitting: Radiation Oncology

## 2013-01-06 ENCOUNTER — Ambulatory Visit: Payer: BC Managed Care – PPO

## 2013-01-07 ENCOUNTER — Ambulatory Visit: Payer: BC Managed Care – PPO

## 2013-01-07 ENCOUNTER — Ambulatory Visit
Admission: RE | Admit: 2013-01-07 | Discharge: 2013-01-07 | Disposition: A | Discharge status: Home / Self Care | Payer: BC Managed Care – PPO | Source: Ambulatory Visit | Attending: Radiation Oncology | Admitting: Radiation Oncology

## 2013-01-08 ENCOUNTER — Ambulatory Visit: Payer: BC Managed Care – PPO

## 2013-01-08 ENCOUNTER — Ambulatory Visit
Admission: RE | Admit: 2013-01-08 | Discharge: 2013-01-08 | Disposition: A | Discharge status: Home / Self Care | Payer: BC Managed Care – PPO | Source: Ambulatory Visit | Attending: Radiation Oncology | Admitting: Radiation Oncology

## 2013-01-11 ENCOUNTER — Ambulatory Visit: Payer: BC Managed Care – PPO

## 2013-01-11 ENCOUNTER — Ambulatory Visit
Admission: RE | Admit: 2013-01-11 | Discharge: 2013-01-11 | Disposition: A | Discharge status: Home / Self Care | Payer: BC Managed Care – PPO | Source: Ambulatory Visit | Attending: Radiation Oncology | Admitting: Radiation Oncology

## 2013-01-12 ENCOUNTER — Ambulatory Visit
Admission: RE | Admit: 2013-01-12 | Discharge: 2013-01-12 | Disposition: A | Discharge status: Home / Self Care | Payer: BC Managed Care – PPO | Source: Ambulatory Visit | Attending: Radiation Oncology | Admitting: Radiation Oncology

## 2013-01-12 ENCOUNTER — Ambulatory Visit: Payer: BC Managed Care – PPO

## 2013-01-12 ENCOUNTER — Encounter: Payer: Self-pay | Admitting: Radiation Oncology

## 2013-01-12 VITALS — BP 120/62 | HR 50 | Temp 97.5°F | Resp 20 | Wt 127.1 lb

## 2013-01-12 DIAGNOSIS — C50411 Malignant neoplasm of upper-outer quadrant of right female breast: Secondary | ICD-10-CM

## 2013-01-12 NOTE — Progress Notes (Signed)
Pt denies pain, fatigue, loss of appetite. Applying Radiaplex to right breast tx area, has bright hyperpigmentation.

## 2013-01-12 NOTE — Progress Notes (Signed)
[facility]    Radiation Oncology [address] [place]     Maryln Gottron, M.D. [place], [place] [address]               Billie Lade, M.D., Ph.D. Phone: [phone]      Molli Hazard A. Kathrynn Running, M.D. Fax: [phone]      Radene Gunning, M.D., Ph.D.         Lurline Hare, M.D.         Grayland Jack, M.D Weekly Treatment Management Note  Name: Cassie Carney     MRN: [id number]        CSN: [id number] Date: 01/12/2013      DOB: 07/24/1951  CC: [name], MD         Levora Angel    Status: Outpatient  Diagnosis: The encounter diagnosis was Cancer of upper-outer quadrant of female breast, right.  Current Dose: 36 Gy  Current Fraction: 20  Planned Dose: 61 Gy  Narrative: Cassie Carney was seen today for weekly treatment management. The chart was checked and port films  were reviewed. She continues to tolerate his treatments well at this time without any significant fatigue itching or discomfort in the right breast area.  Adhesive  Current Outpatient Prescriptions  Medication Sig Dispense Refill  . ALPRAZolam (XANAX) 0.5 MG tablet Take 0.5-1 mg by mouth 4 (four) times daily as needed. For anxiety/insomnia      . Biotin 1 MG CAPS Take by mouth daily.      . hyaluronate sodium (RADIAPLEXRX) GEL Apply topically 2 (two) times daily.      . metoprolol succinate (TOPROL-XL) 25 MG 24 hr tablet Take 25 mg by mouth at bedtime.       . non-metallic deodorant Thornton Papas) MISC Apply 1 application topically daily as needed.      . vitamin B-12 (CYANOCOBALAMIN) 1000 MCG tablet Take 1,000 mcg by mouth daily.       No current facility-administered medications for this encounter.   Facility-Administered Medications Ordered in Other Encounters  Medication Dose Route Frequency Provider Last Rate Last Dose  . sodium chloride 0.9 % injection 10 mL  10 mL Intracatheter PRN Victorino December, MD   10 mL at 09/02/12 1619   Labs:  Lab Results  Component Value Date   WBC 4.2 12/25/2012   HGB  13.6 12/25/2012   HCT 40.8 12/25/2012   MCV 90.2 12/25/2012   PLT 212 12/25/2012   Lab Results  Component Value Date   CREATININE 0.7 12/25/2012   BUN 9.4 12/25/2012   NA 141 12/25/2012   K 4.3 12/25/2012   CL 105 12/25/2012   CO2 27 12/25/2012   Lab Results  Component Value Date   ALT 25 12/25/2012   AST 22 12/25/2012   BILITOT 0.44 12/25/2012    Physical Examination:  weight is 127 lb 1.6 oz (57.652 kg). Her oral temperature is 97.5 F (36.4 C). Her blood pressure is 120/62 and her pulse is 50. Her respiration is 20.    Wt Readings from Last 3 Encounters:  01/12/13 127 lb 1.6 oz (57.652 kg)  01/05/13 126 lb 14.4 oz (57.561 kg)  12/29/12 129 lb 3.2 oz (58.605 kg)    The right breast area shows erythema and some hyperpigmentation changes. There is no moist desquamation. Lungs - Normal respiratory effort, chest expands symmetrically. Lungs are clear to auscultation, no crackles or wheezes.  Heart has regular rhythm and rate  Abdomen is soft and  non tender with normal bowel sounds  Assessment:  Patient tolerating treatments well  Plan: Continue treatment per original radiation prescription

## 2013-01-13 ENCOUNTER — Ambulatory Visit
Admission: RE | Admit: 2013-01-13 | Discharge: 2013-01-13 | Disposition: A | Discharge status: Home / Self Care | Payer: BC Managed Care – PPO | Source: Ambulatory Visit | Attending: Radiation Oncology | Admitting: Radiation Oncology

## 2013-01-13 ENCOUNTER — Ambulatory Visit: Payer: BC Managed Care – PPO

## 2013-01-14 ENCOUNTER — Ambulatory Visit
Admission: RE | Admit: 2013-01-14 | Discharge: 2013-01-14 | Disposition: A | Discharge status: Home / Self Care | Payer: BC Managed Care – PPO | Source: Ambulatory Visit | Attending: Radiation Oncology | Admitting: Radiation Oncology

## 2013-01-14 ENCOUNTER — Ambulatory Visit: Payer: BC Managed Care – PPO

## 2013-01-15 ENCOUNTER — Ambulatory Visit: Payer: BC Managed Care – PPO

## 2013-01-15 ENCOUNTER — Ambulatory Visit
Admission: RE | Admit: 2013-01-15 | Discharge: 2013-01-15 | Disposition: A | Discharge status: Home / Self Care | Payer: BC Managed Care – PPO | Source: Ambulatory Visit | Attending: Radiation Oncology | Admitting: Radiation Oncology

## 2013-01-18 ENCOUNTER — Ambulatory Visit
Admission: RE | Admit: 2013-01-18 | Discharge: 2013-01-18 | Disposition: A | Discharge status: Home / Self Care | Payer: BC Managed Care – PPO | Source: Ambulatory Visit | Attending: Radiation Oncology | Admitting: Radiation Oncology

## 2013-01-18 ENCOUNTER — Ambulatory Visit: Payer: BC Managed Care – PPO

## 2013-01-19 ENCOUNTER — Ambulatory Visit
Admission: RE | Admit: 2013-01-19 | Discharge: 2013-01-19 | Disposition: A | Discharge status: Home / Self Care | Payer: BC Managed Care – PPO | Source: Ambulatory Visit | Attending: Radiation Oncology | Admitting: Radiation Oncology

## 2013-01-19 ENCOUNTER — Ambulatory Visit: Payer: BC Managed Care – PPO

## 2013-01-19 DIAGNOSIS — C50411 Malignant neoplasm of upper-outer quadrant of right female breast: Secondary | ICD-10-CM

## 2013-01-19 NOTE — Progress Notes (Signed)
Pt seen in treatment area by Dr Michell Heinrich, no nurse eval or VS for this PUT visit.

## 2013-01-19 NOTE — Progress Notes (Signed)
Weekly Management Note Current Dose:  45 Gy  Projected Dose:  60.4 Gy   Narrative:  The patient presents for routine under treatment assessment.  CBCT/MVCT images/Port film x-rays were reviewed.  The chart was checked. Examined on treatment machine for electron set up. Skin irritated but using radiaplex with good relief.   Physical Findings: Pink skin   Impression:  The patient is tolerating radiation.  Plan:  Continue treatment as planned.

## 2013-01-20 ENCOUNTER — Ambulatory Visit: Payer: BC Managed Care – PPO

## 2013-01-20 ENCOUNTER — Ambulatory Visit
Admission: RE | Admit: 2013-01-20 | Discharge: 2013-01-20 | Disposition: A | Discharge status: Home / Self Care | Payer: BC Managed Care – PPO | Source: Ambulatory Visit | Attending: Radiation Oncology | Admitting: Radiation Oncology

## 2013-01-21 ENCOUNTER — Ambulatory Visit
Admission: RE | Admit: 2013-01-21 | Discharge: 2013-01-21 | Disposition: A | Discharge status: Home / Self Care | Payer: BC Managed Care – PPO | Source: Ambulatory Visit | Attending: Radiation Oncology | Admitting: Radiation Oncology

## 2013-01-21 ENCOUNTER — Ambulatory Visit: Payer: BC Managed Care – PPO

## 2013-01-22 ENCOUNTER — Ambulatory Visit
Admission: RE | Admit: 2013-01-22 | Discharge: 2013-01-22 | Disposition: A | Discharge status: Home / Self Care | Payer: BC Managed Care – PPO | Source: Ambulatory Visit | Attending: Radiation Oncology | Admitting: Radiation Oncology

## 2013-01-22 ENCOUNTER — Ambulatory Visit: Payer: BC Managed Care – PPO

## 2013-01-22 ENCOUNTER — Encounter: Payer: Self-pay | Admitting: Radiation Oncology

## 2013-01-22 NOTE — Progress Notes (Signed)
[employer], operational Note  Diagnosis: Right Breast Cancer   The patient's CT images from her initial simulation were reviewed to plan her boost treatment to her right chest wall scar.  Measurements were made regarding chest wall thickness. The boost to the  will be delivered with 6 MeV electrons prescribed to the 100% isodose line and daily 1cm bolus.   A special port plan was reviewed and approved. A custom electron cut-out will be used for her boost field. 10 Gy in 5 fractions.  -----------------------------------  Lonie Peak, MD

## 2013-01-25 ENCOUNTER — Ambulatory Visit
Admission: RE | Admit: 2013-01-25 | Discharge: 2013-01-25 | Disposition: A | Discharge status: Home / Self Care | Payer: BC Managed Care – PPO | Source: Ambulatory Visit | Attending: Radiation Oncology | Admitting: Radiation Oncology

## 2013-01-25 ENCOUNTER — Ambulatory Visit: Payer: BC Managed Care – PPO

## 2013-01-26 ENCOUNTER — Ambulatory Visit
Admission: RE | Admit: 2013-01-26 | Discharge: 2013-01-26 | Disposition: A | Discharge status: Home / Self Care | Payer: BC Managed Care – PPO | Source: Ambulatory Visit | Attending: Radiation Oncology | Admitting: Radiation Oncology

## 2013-01-26 ENCOUNTER — Ambulatory Visit: Payer: BC Managed Care – PPO

## 2013-01-26 DIAGNOSIS — C50411 Malignant neoplasm of upper-outer quadrant of right female breast: Secondary | ICD-10-CM

## 2013-01-26 NOTE — Progress Notes (Signed)
Weekly Management Note Current Dose:  54.4 Gy  Projected Dose: 60.4 Gy   Narrative:  The patient presents for routine under treatment assessment.  CBCT/MVCT images/Port film x-rays were reviewed.  The chart was checked. Doing well. No complaints. Skin irritated but tolerable.  Physical Findings: Examined on treatment machine to verify electron placement. Skin is pink.   Impression:  The patient is tolerating radiation.  Plan:  Continue treatment as planned. Continue radiaplex.

## 2013-01-26 NOTE — Progress Notes (Signed)
Pt seen in treatment area by Dr Michell Heinrich. No nurse eval for this visit.

## 2013-01-27 ENCOUNTER — Ambulatory Visit
Admission: RE | Admit: 2013-01-27 | Discharge: 2013-01-27 | Disposition: A | Discharge status: Home / Self Care | Payer: BC Managed Care – PPO | Source: Ambulatory Visit | Attending: Radiation Oncology | Admitting: Radiation Oncology

## 2013-01-27 ENCOUNTER — Ambulatory Visit: Payer: BC Managed Care – PPO

## 2013-01-28 ENCOUNTER — Ambulatory Visit
Admission: RE | Admit: 2013-01-28 | Discharge: 2013-01-28 | Disposition: A | Discharge status: Home / Self Care | Payer: BC Managed Care – PPO | Source: Ambulatory Visit | Attending: Radiation Oncology | Admitting: Radiation Oncology

## 2013-01-28 ENCOUNTER — Ambulatory Visit: Payer: BC Managed Care – PPO

## 2013-01-29 ENCOUNTER — Ambulatory Visit: Payer: BC Managed Care – PPO

## 2013-01-29 ENCOUNTER — Ambulatory Visit
Admission: RE | Admit: 2013-01-29 | Discharge: 2013-01-29 | Disposition: A | Discharge status: Home / Self Care | Payer: BC Managed Care – PPO | Source: Ambulatory Visit | Attending: Radiation Oncology | Admitting: Radiation Oncology

## 2013-01-29 ENCOUNTER — Encounter: Payer: Self-pay | Admitting: Radiation Oncology

## 2013-01-29 VITALS — BP 101/63 | HR 57 | Temp 98.4°F | Ht 63.0 in | Wt 124.2 lb

## 2013-01-29 DIAGNOSIS — C50411 Malignant neoplasm of upper-outer quadrant of right female breast: Secondary | ICD-10-CM

## 2013-01-29 NOTE — Progress Notes (Signed)
Cassie Carney here for her final treatment appointment after 33 fractions to her right breast.  She denies pain.  She does have fatigue.  The skin on her right chest is red and she does have peeling in the axillary area.  She is using radiaplex gel twice a day.  She denies nausea.

## 2013-01-29 NOTE — Progress Notes (Signed)
Weekly Management Note Current Dose: 60.4  Gy  Projected Dose: 60.4 Gy   Narrative:  The patient presents for routine under treatment assessment.  CBCT/MVCT images/Port film x-rays were reviewed.  The chart was checked. Doing well. Has appt with med onc in a few weeks.   Physical Findings: Weight: 124 lb 3.2 oz (56.337 kg). Pink in scar boost area. Dry desquamation in axilla.  Impression:  Finishes RT today.  Plan:  F/u 1 month. Refer to fynn. radiaplex x 2 weeks then lotion with vitamin e. Call with questions.

## 2013-01-29 NOTE — Progress Notes (Signed)
  Radiation Oncology         [phone] ________________________________  Name: [name] MRN: [id number]  Date: 01/29/2013  DOB: 07/30/1951  End of Treatment Note  Diagnosis:   T2N1 right breast cancer  Indication for treatment:  Curative       Radiation treatment dates:   12/16/2012-01/29/2013  Site/dose:  Site/dose:    Right chestwall / 50.4 Gray @ 1.8 Wallace Cullens per fraction x 28 fractions Right supraclavicular fossa / 45 Gy @1 .8 Gy per fraction x 25 fractions Right scar boost / 10 Gray at [employer] per fraction x 5 fractions  Beams/energy:  Opposed Tangents / 6 MV photons LAO / 6 MV photons Enface / 6 MeV electrons  Narrative: The patient tolerated radiation treatment relatively well.   She had the expected skin redness and irritation.   Plan: The patient has completed radiation treatment. The patient will return to radiation oncology clinic for routine followup in one month. I advised them to call or return sooner if they have any questions or concerns related to their recovery or treatment.  ------------------------------------------------  Lurline Hare, MD

## 2013-02-01 ENCOUNTER — Ambulatory Visit: Payer: BC Managed Care – PPO

## 2013-02-02 ENCOUNTER — Ambulatory Visit: Payer: BC Managed Care – PPO

## 2013-02-12 ENCOUNTER — Ambulatory Visit (HOSPITAL_BASED_OUTPATIENT_CLINIC_OR_DEPARTMENT_OTHER): Payer: BC Managed Care – PPO | Admitting: Oncology

## 2013-02-12 ENCOUNTER — Telehealth: Payer: Self-pay | Admitting: *Deleted

## 2013-02-12 ENCOUNTER — Encounter: Payer: Self-pay | Admitting: Oncology

## 2013-02-12 ENCOUNTER — Other Ambulatory Visit (HOSPITAL_BASED_OUTPATIENT_CLINIC_OR_DEPARTMENT_OTHER): Payer: BC Managed Care – PPO

## 2013-02-12 VITALS — BP 101/62 | HR 52 | Temp 98.0°F | Resp 20 | Ht 63.0 in | Wt 123.8 lb

## 2013-02-12 DIAGNOSIS — C50419 Malignant neoplasm of upper-outer quadrant of unspecified female breast: Secondary | ICD-10-CM

## 2013-02-12 DIAGNOSIS — Z9010 Acquired absence of unspecified breast and nipple: Secondary | ICD-10-CM

## 2013-02-12 DIAGNOSIS — C50411 Malignant neoplasm of upper-outer quadrant of right female breast: Secondary | ICD-10-CM

## 2013-02-12 DIAGNOSIS — Z170 Estrogen receptor positive status [ER+]: Secondary | ICD-10-CM

## 2013-02-12 LAB — CBC WITH DIFFERENTIAL/PLATELET
BASO%: 0.7 % (ref 0.0–2.0)
Eosinophils Absolute: 0.1 10*3/uL (ref 0.0–0.5)
HCT: 39.5 % (ref 34.8–46.6)
LYMPH%: 15.6 % (ref 14.0–49.7)
MCHC: 34 g/dL (ref 31.5–36.0)
MCV: 88.6 fL (ref 79.5–101.0)
MONO%: 12.9 % (ref 0.0–14.0)
NEUT%: 68.1 % (ref 38.4–76.8)
Platelets: 204 10*3/uL (ref 145–400)
RBC: 4.46 10*6/uL (ref 3.70–5.45)

## 2013-02-12 LAB — COMPREHENSIVE METABOLIC PANEL (CC13)
Alkaline Phosphatase: 107 U/L (ref 40–150)
Creatinine: 0.7 mg/dL (ref 0.6–1.1)
Glucose: 97 mg/dl (ref 70–99)
Sodium: 142 mEq/L (ref 136–145)
Total Bilirubin: 0.48 mg/dL (ref 0.20–1.20)
Total Protein: 6.5 g/dL (ref 6.4–8.3)

## 2013-02-12 MED ORDER — TAMOXIFEN CITRATE 20 MG PO TABS: 20.0000 mg | ORAL_TABLET | Freq: Every day | ORAL | Status: DC

## 2013-02-12 NOTE — Patient Instructions (Addendum)
Proceed with tamoxifen 20 mg daily  I will see you back in 3 months  Tamoxifen oral tablet What is this medicine? TAMOXIFEN (ta MOX i fen) blocks the effects of estrogen. It is commonly used to treat breast cancer. It is also used to decrease the chance of breast cancer coming back in women who have received treatment for the disease. It may also help prevent breast cancer in women who have a high risk of developing breast cancer. This medicine may be used for other purposes; ask your health care provider or pharmacist if you have questions. What should I tell my health care provider before I take this medicine? They need to know if you have any of these conditions: -blood clots -blood disease -cataracts or impaired eyesight -endometriosis -high calcium levels -high cholesterol -irregular menstrual cycles -liver disease -stroke -uterine fibroids -an unusual or allergic reaction to tamoxifen, other medicines, foods, dyes, or preservatives -pregnant or trying to get pregnant -breast-feeding How should I use this medicine? Take this medicine by mouth with a glass of water. Follow the directions on the prescription label. You can take it with or without food. Take your medicine at regular intervals. Do not take your medicine more often than directed. Do not stop taking except on your doctor's advice. A special MedGuide will be given to you by the pharmacist with each prescription and refill. Be sure to read this information carefully each time. Talk to your pediatrician regarding the use of this medicine in children. While this drug may be prescribed for selected conditions, precautions do apply. Overdosage: If you think you have taken too much of this medicine contact a poison control center or emergency room at once. NOTE: This medicine is only for you. Do not share this medicine with others. What if I miss a dose? If you miss a dose, take it as soon as you can. If it is almost time for  your next dose, take only that dose. Do not take double or extra doses. What may interact with this medicine? -aminoglutethimide -bromocriptine -chemotherapy drugs -female hormones, like estrogens and birth control pills -letrozole -medroxyprogesterone -phenobarbital -rifampin -warfarin This list may not describe all possible interactions. Give your health care provider a list of all the medicines, herbs, non-prescription drugs, or dietary supplements you use. Also tell them if you smoke, drink alcohol, or use illegal drugs. Some items may interact with your medicine. What should I watch for while using this medicine? Visit your doctor or health care professional for regular checks on your progress. You will need regular pelvic exams, breast exams, and mammograms. If you are taking this medicine to reduce your risk of getting breast cancer, you should know that this medicine does not prevent all types of breast cancer. If breast cancer or other problems occur, there is no guarantee that it will be found at an early stage. Do not become pregnant while taking this medicine or for 2 months after stopping this medicine. Stop taking this medicine if you get pregnant or think you are pregnant and contact your doctor. This medicine may harm your unborn baby. Women who can possibly become pregnant should use birth control methods that do not use hormones during tamoxifen treatment and for 2 months after therapy has stopped. Talk with your health care provider for birth control advice. Do not breast feed while taking this medicine. What side effects may I notice from receiving this medicine? Side effects that you should report to your doctor or health   care professional as soon as possible: -changes in vision (blurred vision) -changes in your menstrual cycle -difficulty breathing or shortness of breath -difficulty walking or talking -new breast lumps -numbness -pelvic pain or pressure -redness,  blistering, peeling or loosening of the skin, including inside the mouth -skin rash or itching (hives) -sudden chest pain -swelling of lips, face, or tongue -swelling, pain or tenderness in your calf or leg -unusual bruising or bleeding -vaginal discharge that is bloody, brown, or rust -weakness -yellowing of the whites of the eyes or skin Side effects that usually do not require medical attention (report to your doctor or health care professional if they continue or are bothersome): -fatigue -hair loss, although uncommon and is usually mild -headache -hot flashes -impotence (in men) -nausea, vomiting (mild) -vaginal discharge (white or clear) This list may not describe all possible side effects. Call your doctor for medical advice about side effects. You may report side effects to FDA at 1-800-FDA-1088. Where should I keep my medicine? Keep out of the reach of children. Store at room temperature between 20 and 25 degrees C (68 and 77 degrees F). Protect from light. Keep container tightly closed. Throw away any unused medicine after the expiration date. NOTE: This sheet is a summary. It may not cover all possible information. If you have questions about this medicine, talk to your doctor, pharmacist, or health care provider.  2012, Elsevier/Gold Standard. (06/23/2008 12:01:56 PM) 

## 2013-02-12 NOTE — Telephone Encounter (Signed)
appts made and printed...td 
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
## 2013-02-18 ENCOUNTER — Encounter (INDEPENDENT_AMBULATORY_CARE_PROVIDER_SITE_OTHER): Payer: Self-pay | Admitting: General Surgery

## 2013-02-18 ENCOUNTER — Other Ambulatory Visit (INDEPENDENT_AMBULATORY_CARE_PROVIDER_SITE_OTHER): Payer: Self-pay | Admitting: General Surgery

## 2013-02-18 NOTE — Progress Notes (Signed)
Patient ID: Cassie Carney, female   DOB: 1951-03-08, 62 y.o.   MRN: [id number] She is ready to have her Port-a-cath removed.  I spoke with her about this yesterday.  We went over the procedure and risks (which include but are not limited to bleeding, infection, wound problems, anesthesia).  We will get this scheduled.

## 2013-02-19 ENCOUNTER — Encounter (HOSPITAL_COMMUNITY): Payer: Self-pay | Admitting: Pharmacy Technician

## 2013-02-23 ENCOUNTER — Encounter: Payer: Self-pay | Admitting: Radiation Oncology

## 2013-02-24 ENCOUNTER — Encounter (HOSPITAL_COMMUNITY)
Admission: RE | Admit: 2013-02-24 | Discharge: 2013-02-24 | Disposition: A | Discharge status: Home / Self Care | Payer: BC Managed Care – PPO | Source: Ambulatory Visit | Attending: General Surgery | Admitting: General Surgery

## 2013-02-24 ENCOUNTER — Encounter (HOSPITAL_COMMUNITY): Payer: Self-pay

## 2013-02-24 HISTORY — DX: Cardiac arrhythmia, unspecified: I49.9

## 2013-02-24 LAB — CBC
HCT: 37.8 % (ref 36.0–46.0)
Hemoglobin: 13.1 g/dL (ref 12.0–15.0)
MCH: 30 pg (ref 26.0–34.0)
MCHC: 34.7 g/dL (ref 30.0–36.0)

## 2013-02-24 LAB — BASIC METABOLIC PANEL
BUN: 12 mg/dL (ref 6–23)
Chloride: 105 mEq/L (ref 96–112)
Glucose, Bld: 101 mg/dL — ABNORMAL HIGH (ref 70–99)
Potassium: 4.1 mEq/L (ref 3.5–5.1)

## 2013-02-24 NOTE — Pre-Procedure Instructions (Signed)
Cassie Carney  02/24/2013   Your procedure is scheduled on:  02-26-2013  Friday   Report to [facility] at 8:00 AM.  Call this number if you have problems the morning of surgery: [phone]   Remember:   Do not eat food or drink liquids after midnight.    Take these medicines the morning of surgery with A SIP OF WATER: alprazolam(Xanax) if needed              tamoxifen   Do not wear jewelry, make-up or nail polish.  Do not wear lotions, powders, or perfumes. You may wear deodorant.  Do not shave 48 hours prior to surgery.   Do not bring valuables to the hospital.  Contacts, dentures or bridgework may not be worn into surgery.       Patients discharged the day of surgery will not be allowed to drive home.   Name and phone number of your driver: ___________________   Special Instructions: Shower using CHG 2 nights before surgery and the night before surgery.  If you shower the day of surgery use CHG.  Use special wash - you have one bottle of CHG for all showers.  You should use approximately 1/3 of the bottle for each shower.   Please read over the following fact sheets that you were given: Pain Booklet and Surgical Site Infection Prevention

## 2013-02-25 ENCOUNTER — Ambulatory Visit
Admission: RE | Admit: 2013-02-25 | Discharge: 2013-02-25 | Disposition: A | Discharge status: Home / Self Care | Payer: BC Managed Care – PPO | Source: Ambulatory Visit | Attending: Radiation Oncology | Admitting: Radiation Oncology

## 2013-02-25 VITALS — BP 133/64 | HR 54 | Temp 97.9°F | Ht 63.0 in | Wt 123.8 lb

## 2013-02-25 DIAGNOSIS — C50411 Malignant neoplasm of upper-outer quadrant of right female breast: Secondary | ICD-10-CM

## 2013-02-25 HISTORY — DX: Personal history of irradiation: Z92.3

## 2013-02-25 MED ORDER — CEFAZOLIN SODIUM-DEXTROSE 2-3 GM-% IV SOLR
2.0000 g | INTRAVENOUS | Status: AC
  Administered 2013-02-26: 2 g via INTRAVENOUS
  Filled 2013-02-25: qty 50
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
## 2013-02-25 NOTE — Progress Notes (Signed)
Ms. Schmuck here for follow up after treatment to her right breast.  She denies pain, fatigue and nausea.  The skin on her right chest is slightly pink and intact.  She uses the radiaplex gel occasionally.  She has been taking tamoxifen for two weeks.

## 2013-02-26 ENCOUNTER — Ambulatory Visit (HOSPITAL_COMMUNITY)
Admission: RE | Admit: 2013-02-26 | Discharge: 2013-02-26 | Disposition: A | Discharge status: Home / Self Care | Payer: BC Managed Care – PPO | Source: Ambulatory Visit | Attending: General Surgery | Admitting: General Surgery

## 2013-02-26 ENCOUNTER — Encounter (HOSPITAL_COMMUNITY): Payer: Self-pay | Admitting: Anesthesiology

## 2013-02-26 ENCOUNTER — Encounter (HOSPITAL_COMMUNITY): Payer: Self-pay | Admitting: *Deleted

## 2013-02-26 ENCOUNTER — Ambulatory Visit (HOSPITAL_COMMUNITY): Payer: BC Managed Care – PPO | Admitting: Anesthesiology

## 2013-02-26 ENCOUNTER — Encounter (HOSPITAL_COMMUNITY)
Admission: RE | Disposition: A | Discharge status: Home / Self Care | Payer: Self-pay | Source: Ambulatory Visit | Attending: General Surgery

## 2013-02-26 DIAGNOSIS — Z853 Personal history of malignant neoplasm of breast: Secondary | ICD-10-CM | POA: Insufficient documentation

## 2013-02-26 DIAGNOSIS — M129 Arthropathy, unspecified: Secondary | ICD-10-CM | POA: Insufficient documentation

## 2013-02-26 DIAGNOSIS — Z452 Encounter for adjustment and management of vascular access device: Secondary | ICD-10-CM

## 2013-02-26 DIAGNOSIS — I499 Cardiac arrhythmia, unspecified: Secondary | ICD-10-CM | POA: Insufficient documentation

## 2013-02-26 DIAGNOSIS — I059 Rheumatic mitral valve disease, unspecified: Secondary | ICD-10-CM | POA: Insufficient documentation

## 2013-02-26 DIAGNOSIS — F411 Generalized anxiety disorder: Secondary | ICD-10-CM | POA: Insufficient documentation

## 2013-02-26 HISTORY — PX: PORT-A-CATH REMOVAL: SHX5289

## 2013-02-26 SURGERY — REMOVAL PORT-A-CATH
Anesthesia: Monitor Anesthesia Care | Wound class: Clean

## 2013-02-26 MED ORDER — ONDANSETRON HCL 4 MG/2ML IJ SOLN: 4.0000 mg | Freq: Once | INTRAMUSCULAR | Status: DC | PRN

## 2013-02-26 MED ORDER — ONDANSETRON HCL 4 MG/2ML IJ SOLN
INTRAMUSCULAR | Status: DC | PRN
  Administered 2013-02-26: 4 mg via INTRAVENOUS

## 2013-02-26 MED ORDER — 0.9 % SODIUM CHLORIDE (POUR BTL) OPTIME
TOPICAL | Status: DC | PRN
  Administered 2013-02-26: 1000 mL

## 2013-02-26 MED ORDER — LIDOCAINE HCL (CARDIAC) 20 MG/ML IV SOLN
INTRAVENOUS | Status: DC | PRN
  Administered 2013-02-26: 50 mg via INTRAVENOUS

## 2013-02-26 MED ORDER — FENTANYL CITRATE 0.05 MG/ML IJ SOLN
INTRAMUSCULAR | Status: DC | PRN
  Administered 2013-02-26 (×4): 50 ug via INTRAVENOUS

## 2013-02-26 MED ORDER — LIDOCAINE-EPINEPHRINE (PF) 1 %-1:200000 IJ SOLN
INTRAMUSCULAR | Status: AC
  Filled 2013-02-26: qty 10

## 2013-02-26 MED ORDER — LACTATED RINGERS IV SOLN
INTRAVENOUS | Status: DC | PRN
  Administered 2013-02-26: 10:00:00 via INTRAVENOUS

## 2013-02-26 MED ORDER — PROPOFOL 10 MG/ML IV BOLUS
INTRAVENOUS | Status: DC | PRN
  Administered 2013-02-26 (×2): 20 mg via INTRAVENOUS

## 2013-02-26 MED ORDER — HYDROMORPHONE HCL PF 1 MG/ML IJ SOLN
INTRAMUSCULAR | Status: AC
  Filled 2013-02-26: qty 1

## 2013-02-26 MED ORDER — HYDROMORPHONE HCL PF 1 MG/ML IJ SOLN
0.2500 mg | INTRAMUSCULAR | Status: DC | PRN
  Administered 2013-02-26: 0.5 mg via INTRAVENOUS

## 2013-02-26 MED ORDER — LIDOCAINE-EPINEPHRINE (PF) 1 %-1:200000 IJ SOLN
INTRAMUSCULAR | Status: DC | PRN
  Administered 2013-02-26: 11:00:00 via SUBCUTANEOUS

## 2013-02-26 MED ORDER — BUPIVACAINE HCL (PF) 0.25 % IJ SOLN
INTRAMUSCULAR | Status: AC
  Filled 2013-02-26: qty 30

## 2013-02-26 MED ORDER — MIDAZOLAM HCL 5 MG/5ML IJ SOLN
INTRAMUSCULAR | Status: DC | PRN
  Administered 2013-02-26: 2 mg via INTRAVENOUS

## 2013-02-26 SURGICAL SUPPLY — 42 items
BENZOIN TINCTURE PRP APPL 2/3 (GAUZE/BANDAGES/DRESSINGS) IMPLANT
BLADE SURG 15 STRL LF DISP TIS (BLADE) ×1 IMPLANT
BLADE SURG 15 STRL SS (BLADE) ×1
CHLORAPREP W/TINT 10.5 ML (MISCELLANEOUS) ×2 IMPLANT
CLOTH BEACON ORANGE TIMEOUT ST (SAFETY) ×2 IMPLANT
COVER SURGICAL LIGHT HANDLE (MISCELLANEOUS) ×2 IMPLANT
DECANTER SPIKE VIAL GLASS SM (MISCELLANEOUS) ×4 IMPLANT
DERMABOND ADVANCED (GAUZE/BANDAGES/DRESSINGS) ×1
DERMABOND ADVANCED .7 DNX12 (GAUZE/BANDAGES/DRESSINGS) ×1 IMPLANT
DRAPE PED LAPAROTOMY (DRAPES) ×2 IMPLANT
DRAPE UTILITY 15X26 W/TAPE STR (DRAPE) ×4 IMPLANT
DRESSING TELFA 8X3 (GAUZE/BANDAGES/DRESSINGS) ×2 IMPLANT
DRSG TEGADERM 4X4.75 (GAUZE/BANDAGES/DRESSINGS) ×2 IMPLANT
ELECT CAUTERY BLADE 6.4 (BLADE) ×2 IMPLANT
ELECT REM PT RETURN 9FT ADLT (ELECTROSURGICAL) ×2
ELECTRODE REM PT RTRN 9FT ADLT (ELECTROSURGICAL) ×1 IMPLANT
GAUZE SPONGE 2X2 8PLY STRL LF (GAUZE/BANDAGES/DRESSINGS) IMPLANT
GAUZE SPONGE 4X4 16PLY XRAY LF (GAUZE/BANDAGES/DRESSINGS) IMPLANT
GLOVE BIOGEL PI IND STRL 7.0 (GLOVE) ×1 IMPLANT
GLOVE BIOGEL PI IND STRL 8 (GLOVE) ×1 IMPLANT
GLOVE BIOGEL PI INDICATOR 7.0 (GLOVE) ×1
GLOVE BIOGEL PI INDICATOR 8 (GLOVE) ×1
GLOVE ECLIPSE 8.0 STRL XLNG CF (GLOVE) ×2 IMPLANT
GLOVE SURG SS PI 7.0 STRL IVOR (GLOVE) ×2 IMPLANT
GOWN STRL NON-REIN LRG LVL3 (GOWN DISPOSABLE) ×4 IMPLANT
KIT BASIN OR (CUSTOM PROCEDURE TRAY) ×2 IMPLANT
KIT ROOM TURNOVER OR (KITS) ×2 IMPLANT
NEEDLE HYPO 25GX1X1/2 BEV (NEEDLE) ×2 IMPLANT
NS IRRIG 1000ML POUR BTL (IV SOLUTION) ×2 IMPLANT
PACK SURGICAL SETUP 50X90 (CUSTOM PROCEDURE TRAY) ×2 IMPLANT
PAD ARMBOARD 7.5X6 YLW CONV (MISCELLANEOUS) ×4 IMPLANT
PENCIL BUTTON HOLSTER BLD 10FT (ELECTRODE) ×2 IMPLANT
SPONGE GAUZE 2X2 STER 10/PKG (GAUZE/BANDAGES/DRESSINGS)
STRIP CLOSURE SKIN 1/2X4 (GAUZE/BANDAGES/DRESSINGS) IMPLANT
SUT MON AB 4-0 PC3 18 (SUTURE) ×2 IMPLANT
SUT VIC AB 3-0 SH 27 (SUTURE) ×1
SUT VIC AB 3-0 SH 27X BRD (SUTURE) ×1 IMPLANT
SYR CONTROL 10ML LL (SYRINGE) ×2 IMPLANT
TAPE PAPER 2X10 WHT MICROPORE (GAUZE/BANDAGES/DRESSINGS) ×2 IMPLANT
TOWEL OR 17X24 6PK STRL BLUE (TOWEL DISPOSABLE) ×2 IMPLANT
TOWEL OR 17X26 10 PK STRL BLUE (TOWEL DISPOSABLE) ×2 IMPLANT
WATER STERILE IRR 1000ML POUR (IV SOLUTION) IMPLANT

## 2013-02-26 NOTE — H&P (Signed)
Cassie Carney is an 62 y.o. female.   Chief Complaint:   Here for Port-a-cath removal HPI:   She has an indwelling Port-a-cath for chemotherapy for her breast cancer.  She has completed her treatments and no longer needs the Port-a-cath.  Past Medical History  Diagnosis Date  . Mitral valve prolapse   . Night sweats   . Hot flashes   . PONV (postoperative nausea and vomiting)   . Anxiety   . Arthritis     fingers, feet, elbow  . History of chemotherapy 07/01/12 -10/15/12    6 cycles  . Breast cancer 06/08/12    right, ER/PR +, Her 2 -  . History of radiation therapy 12/16/12-01/29/13    r chestwall/supraclavicular fossa  . Dysrhythmia     psvt  takes metoprolol to control rate    Past Surgical History  Procedure Laterality Date  . Broken arm    . Broken wrist    . Dilation and curettage of uterus    . Portacath placement  06/29/2012    Procedure: INSERTION PORT-A-CATH;  Surgeon: Cassie Pollack, MD;  Location: [facility];  Service: General;  Laterality: N/A;   ultrasound guided Port-A-Cath insertion  . External ear surgery      at early age ears pinned back  . Total mastectomy  11/17/2012    Procedure: R TOTAL MASTECTOMY;  Surgeon: Cassie Pollack, MD;  Location: [facility] OR;  Service: General;  Laterality: Left;  Marland Kitchen Mastectomy with axillary lymph node dissection  11/17/2012    Procedure: MASTECTOMY WITH AXILLARY LYMPH NODE DISSECTION;  Surgeon: Cassie Pollack, MD;  Location: [facility] OR;  Service: General;  Laterality: Right;  . Simple mastectomy  11/17/12    left-benign    Family History  Problem Relation Age of Onset  . Cancer Father     squamous cell carcinoma  . Stroke Mother   . Hypertension Sister   . Stroke Maternal Grandmother    Social History:  reports that she has never smoked. She has never used smokeless tobacco. She reports that she drinks about 4.2 ounces of alcohol per week. She reports that she does not use illicit drugs.  Allergies:  Allergies   Allergen Reactions  . Adhesive (Tape)     Rash    Medications Prior to Admission  Medication Sig Dispense Refill  . ALPRAZolam (XANAX) 0.5 MG tablet Take 0.5-1 mg by mouth 4 (four) times daily as needed. For anxiety/insomnia      . Biotin 1 MG CAPS Take by mouth daily.      . CYANOCOBALAMIN PO Take 2,000 mg by mouth daily. Time release capsule      . hyaluronate sodium (RADIAPLEXRX) GEL Apply topically 2 (two) times daily.      . metoprolol succinate (TOPROL-XL) 25 MG 24 hr tablet Take 25 mg by mouth at bedtime.       . tamoxifen (NOLVADEX) 20 MG tablet Take 1 tablet (20 mg total) by mouth daily.  90 tablet  12    Results for orders placed during the hospital encounter of 02/24/13 (from the past 48 hour(s))  SURGICAL PCR SCREEN     Status: None   Collection Time    02/24/13 12:27 PM      Result Value Range   MRSA, PCR NEGATIVE  NEGATIVE   Staphylococcus aureus NEGATIVE  NEGATIVE   Comment:            The Xpert SA Assay (FDA  approved for NASAL specimens     in patients over 77 years of age),     is one component of     a comprehensive surveillance     program.  Test performance has     been validated by [employer] for patients greater     than or equal to 7 year old.     It is not intended     to diagnose infection nor to     guide or monitor treatment.  BASIC METABOLIC PANEL     Status: Abnormal   Collection Time    02/24/13 12:27 PM      Result Value Range   Sodium 140  135 - 145 mEq/L   Potassium 4.1  3.5 - 5.1 mEq/L   Chloride 105  96 - 112 mEq/L   CO2 26  19 - 32 mEq/L   Glucose, Bld 101 (*) 70 - 99 mg/dL   BUN 12  6 - 23 mg/dL   Creatinine, Ser 9.81  0.50 - 1.10 mg/dL   Calcium 9.8  8.4 - 19.1 mg/dL   GFR calc non Af Amer >90  >90 mL/min   GFR calc Af Amer >90  >90 mL/min   Comment:            The eGFR has been calculated     using the CKD EPI equation.     This calculation has not been     validated in all clinical     situations.     eGFR's  persistently     <90 mL/min signify     possible Chronic Kidney Disease.  CBC     Status: None   Collection Time    02/24/13 12:27 PM      Result Value Range   WBC 4.5  4.0 - 10.5 K/uL   RBC 4.37  3.87 - 5.11 MIL/uL   Hemoglobin 13.1  12.0 - 15.0 g/dL   HCT 47.8  29.5 - 62.1 %   MCV 86.5  78.0 - 100.0 fL   MCH 30.0  26.0 - 34.0 pg   MCHC 34.7  30.0 - 36.0 g/dL   RDW 30.8  65.7 - 84.6 %   Platelets 213  150 - 400 K/uL   No results found.  Review of Systems  Constitutional: Negative.     Blood pressure 103/65, pulse 58, temperature 97.9 F (36.6 C), temperature source Oral, resp. rate 20, SpO2 99.00%. Physical Exam  Constitutional: She appears well-developed and well-nourished. No distress.  HENT:  Head: Normocephalic and atraumatic.  Cardiovascular: Normal rate and regular rhythm.   Respiratory: Effort normal and breath sounds normal.  Port in right upper chest wall.  Bilateral chest wall scars.     Assessment/Plan Indwelling Port-a-cath is no longer needed.  Plan:  Port-a-cath removal.  Cassie Carney J 02/26/2013, 9:40 AM

## 2013-02-26 NOTE — Transfer of Care (Signed)
Immediate Anesthesia Transfer of Care Note  Patient: Cassie Carney  Procedure(s) Performed: Procedure(s): REMOVAL PORT-A-CATH (N/A)  Patient Location: PACU  Anesthesia Type:MAC  Level of Consciousness: awake, alert , oriented and patient cooperative  Airway & Oxygen Therapy: Patient Spontanous Breathing  Post-op Assessment: Report given to PACU RN, Post -op Vital signs reviewed and stable and Patient moving all extremities  Post vital signs: Reviewed and stable  Complications: No apparent anesthesia complications

## 2013-02-26 NOTE — Anesthesia Preprocedure Evaluation (Signed)
Anesthesia Evaluation  Patient identified by MRN, date of birth, ID band Patient awake    Reviewed: Allergy & Precautions, H&P , NPO status , Patient's Chart, lab work & pertinent test results  Airway Mallampati: I TM Distance: >3 FB Neck ROM: full    Dental   Pulmonary          Cardiovascular + dysrhythmias Rhythm:regular Rate:Normal     Neuro/Psych    GI/Hepatic   Endo/Other    Renal/GU      Musculoskeletal   Abdominal   Peds  Hematology   Anesthesia Other Findings   Reproductive/Obstetrics                           Anesthesia Physical Anesthesia Plan  ASA: II  Anesthesia Plan: MAC   Post-op Pain Management:    Induction: Intravenous  Airway Management Planned: Mask  Additional Equipment:   Intra-op Plan:   Post-operative Plan:   Informed Consent: I have reviewed the patients History and Physical, chart, labs and discussed the procedure including the risks, benefits and alternatives for the proposed anesthesia with the patient or authorized representative who has indicated his/her understanding and acceptance.     Plan Discussed with: CRNA, Anesthesiologist and Surgeon  Anesthesia Plan Comments:         Anesthesia Quick Evaluation

## 2013-02-26 NOTE — Anesthesia Postprocedure Evaluation (Signed)
  Anesthesia Post-op Note  Patient: Cassie Carney  Procedure(s) Performed: Procedure(s): REMOVAL PORT-A-CATH (N/A)  Patient Location: PACU  Anesthesia Type:MAC  Level of Consciousness: awake, alert , oriented and patient cooperative  Airway and Oxygen Therapy: Patient Spontanous Breathing  Post-op Pain: mild  Post-op Assessment: Post-op Vital signs reviewed, Patient's Cardiovascular Status Stable, Respiratory Function Stable, Patent Airway, No signs of Nausea or vomiting and Pain level controlled  Post-op Vital Signs: stable  Complications: No apparent anesthesia complications

## 2013-02-26 NOTE — Op Note (Signed)
PREOPERATIVE DIAGNOSIS:  Indwelling Port-a-cath.  POSTOPERATIVE DIAGNOSIS:  Same  PROCEDURE:    Porta-cath removal  SURGEON:  Avel Peace, M.D.  ANESTHESIA:  Local with MAC  INDICATION:  This is a 62 year old female/female with breast cancer who has received chemotherapy via the Porta-cath.  The Porta-cath is no longer needed.  She now presents for removal.  The procedure, risks, and aftercare have been explained preoperatively.   The right upper chest wall and neck were sterilely prepped and draped. Local anesthetic was infiltrated at the site of the port in the chest wall superficially and deep. The previous scar was incised sharply and then using electrocautery the subcutaneous tissue was divided until the port and catheter were identified.  The fibrous sheath was dissected free from the catheter and it was pulled out of the internal jugular/subclavian vein. Direct pressure was held over the vein site for 10-15 minutes.  The port was then dissected free from the chest wall using electrocautery. The port and catheter were removed intact. The chest wall area was inspected and bleeding controlled with electrocautery. Once hemostasis was adequate, the chest wall incision was closed in 2 layers. The subcutaneous tissues approximated with running 3-0 Vicryl suture. The skin was closed with a running 4-0 Monocryl subcuticular stitch. Steri-Strips and a sterile dressing were applied.  She tolerated the procedure well without any apparent complications and was taken to the recovery room in satisfactory condition.

## 2013-02-26 NOTE — Interval H&P Note (Signed)
History and Physical Interval Note:  02/26/2013 9:54 AM  Cassie Carney  has presented today for surgery, with the diagnosis of Breast Cancer, indwelling Port-a-cath removal   The various methods of treatment have been discussed with the patient and family. After consideration of risks, benefits and other options for treatment, the patient has consented to  Procedure(s): REMOVAL PORT-A-CATH (N/A) as a surgical intervention .  The patient's history has been reviewed, patient examined, no change in status, stable for surgery.  I have reviewed the patient's chart and labs.  Questions were answered to the patient's satisfaction.     Shabrea Weldin [facility]

## 2013-03-01 ENCOUNTER — Encounter (HOSPITAL_COMMUNITY): Payer: Self-pay | Admitting: General Surgery

## 2013-03-09 ENCOUNTER — Ambulatory Visit (INDEPENDENT_AMBULATORY_CARE_PROVIDER_SITE_OTHER): Payer: BC Managed Care – PPO | Admitting: General Surgery

## 2013-03-09 DIAGNOSIS — Z853 Personal history of malignant neoplasm of breast: Secondary | ICD-10-CM

## 2013-03-09 NOTE — Patient Instructions (Signed)
Call if you feel any new chest wall nodules.

## 2013-03-09 NOTE — Progress Notes (Addendum)
Procedure:  Right modified radical mastectomy. Left total mastectomy.  Date:  11/17/2012  Pathology: Right T2N1.  Left benign  History:  She presents for a long term breast cancer visit and post port-a-cath removal visit.  She is doing well.  She has completed XRT and chemotherapy. Exam: General- Is in NAD. Chest wall-both incisions are clean and intact with no nodules.  Previous port-a-cath site incision is clean  Lymph nodes-no supraclavicular, cervical or axillary adenopathy  Assessment:  T2N1 Right breast cancer status post right modified radical mastectomy and left total mastectomy and recent Port-a-cath removal-no evidence of recurrence.  Plan:  RTC in 3 months.

## 2013-05-09 IMAGING — PT NM PET TUM IMG INITIAL (PI) SKULL BASE T - THIGH
4 series · 25 of 25 positions shown · non-contrast
Comparison: No priors.

CLINICAL DATA: Initial treatment strategy for breast cancer.

NUCLEAR MEDICINE PET SKULL BASE TO THIGH
Fasting Blood Glucose:  102
TECHNIQUE: 18.3 mCi F-18 FDG was injected intravenously. CT data
was obtained and used for attenuation correction and anatomic
localization only.  (This was not acquired as a diagnostic CT
examination.) Additional exam technical data entered on
technologist worksheet.

[Series 1: pet ac · axial · 3.3mm · 4.69mm/px · z∈[-726,+0]mm · 10 of 223 slices shown]
[im 1/223]
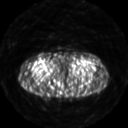
[im 25/223]
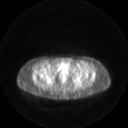
[im 50/223]
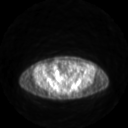
[im 75/223]
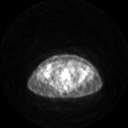
[im 99/223]
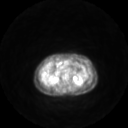
[im 124/223]
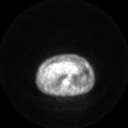
[im 149/223]
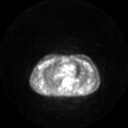
[im 173/223]
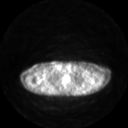
[im 198/223]
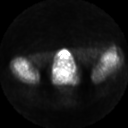
[im 223/223]
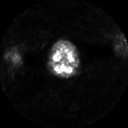

[Series 2: ct images · axial · 3.8mm · 0.98mm/px · z∈[-726,+0]mm · 11 of 223 slices shown]
[im 1/223]
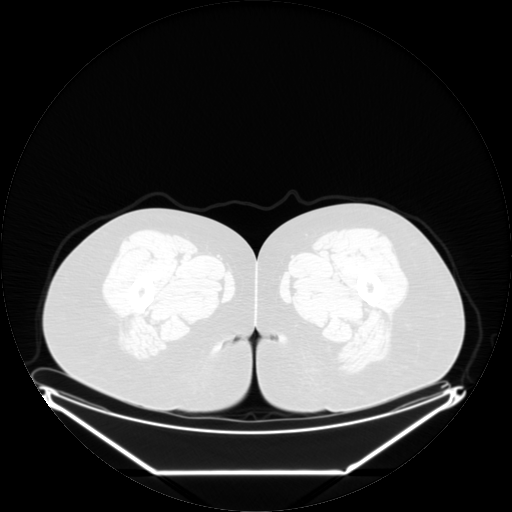
[im 23/223]
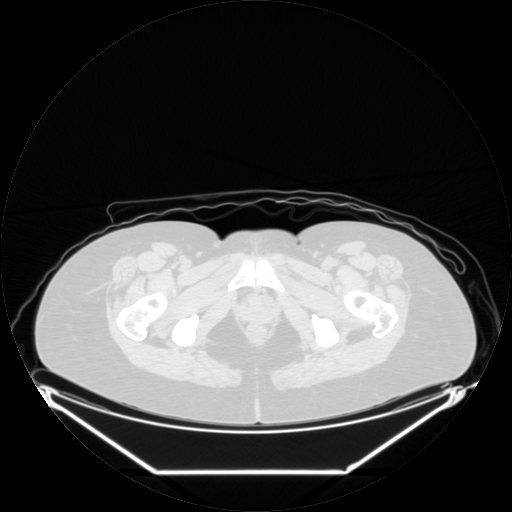
[im 45/223]
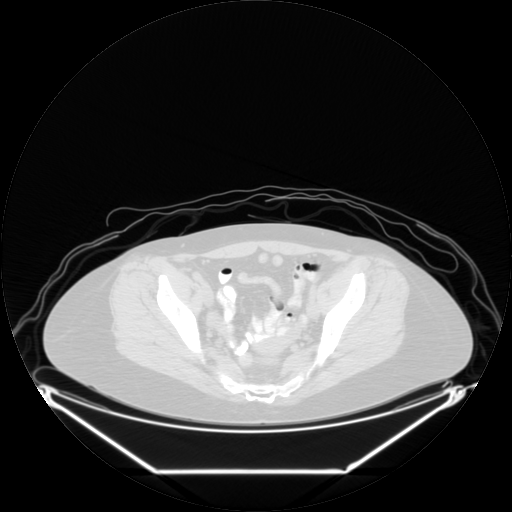
[im 67/223]
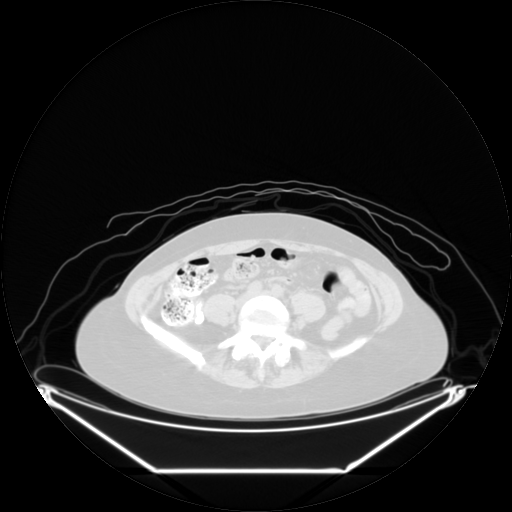
[im 89/223]
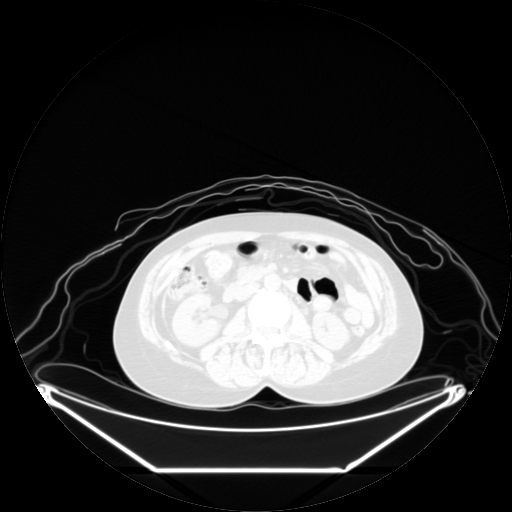
[im 112/223]
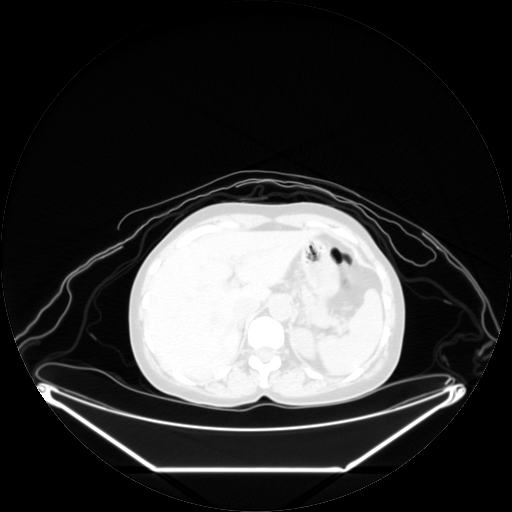
[im 134/223]
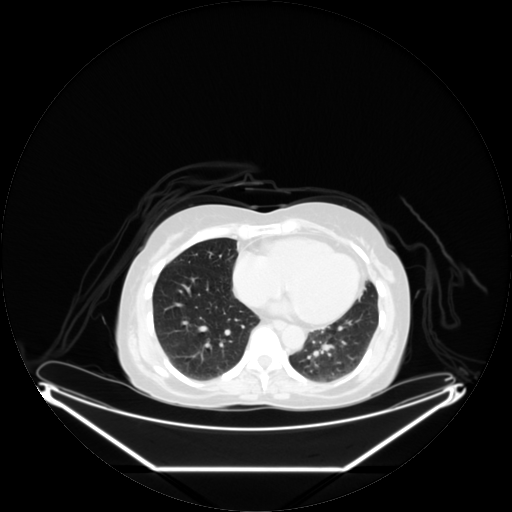
[im 156/223]
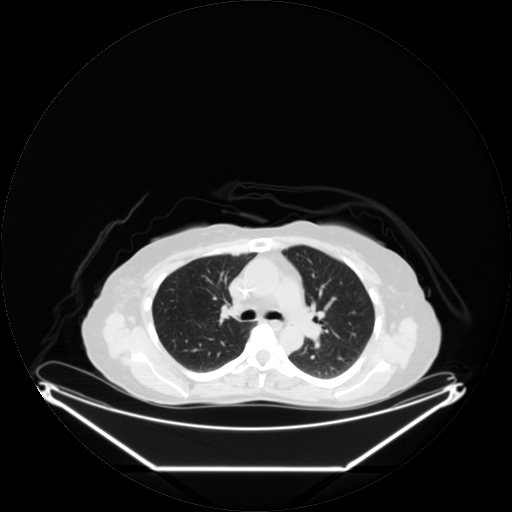
[im 178/223]
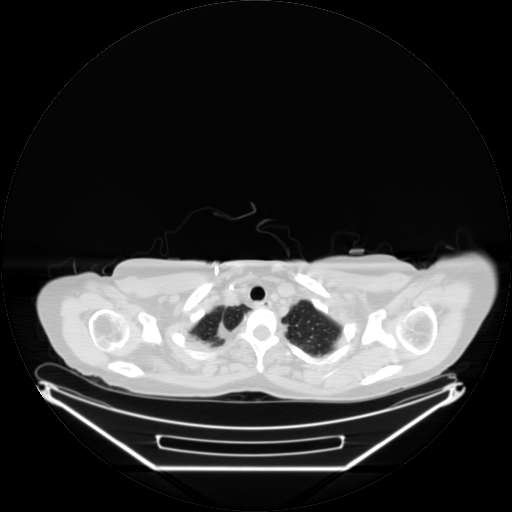
[im 200/223]
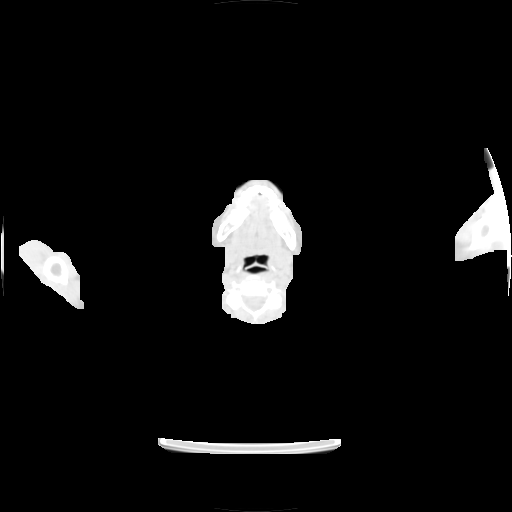
[im 223/223]
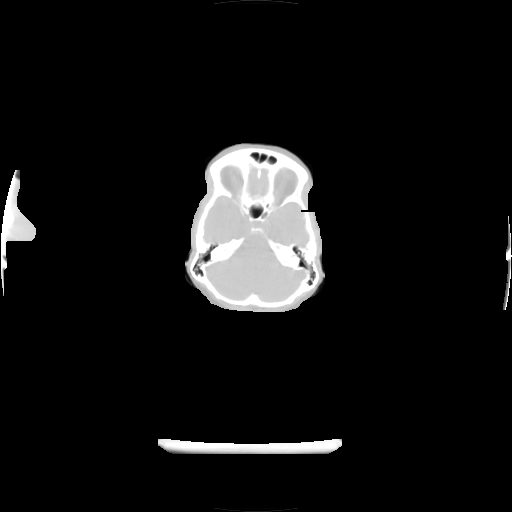

[Series 123: mip · coronal · 3.3mm · 4.69mm/px · 1 of 30 slices shown]
[im 1/30]
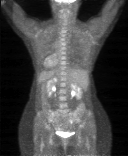

[Series 153: reformatted · coronal · 4.7mm · 5.83mm/px · 3 of 53 slices shown]
[im 1/53]
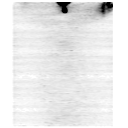
[im 27/53]
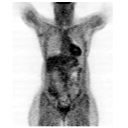
[im 53/53]
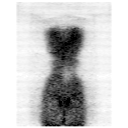

[25 of 25 positions shown; findings below may reference images not displayed]

FINDINGS: Neck: No hypermetabolic lymph nodes in the neck.

Chest:  No hypermetabolic mediastinal or hilar nodes.  No
suspicious pulmonary nodules on the CT scan. Right-sided internal
jugular single lumen Port-A-Cath with tip terminating in the distal
superior vena cava. Borderline cardiomegaly. In the upper outer
quadrant of the right breast there is an ill-defined area of soft
tissue prominence that is difficult to discretely measure, that
demonstrates some very low level increased metabolic activity
(SUVmax = 2.3). In the right axilla and there is some soft tissue
stranding and a small low attenuation fluid collection measuring
approximately 2.2 cm in diameter, likely to represent a
postoperative seroma.  Throughout this region there is some low
level hypermetabolism (SUVmax = 2.8)which is not unexpected in the
recent postoperative state.

Abdomen/Pelvis:  No abnormal hypermetabolic activity within the
liver, pancreas, adrenal glands, or spleen.  No hypermetabolic
lymph nodes in the abdomen or pelvis. Two low attenuation lesions
in the posterior aspect of the right lobe of the liver, largest
which measures 2 cm in segment 7, and does not exhibit
hypermetabolism.  Nodular low attenuation thickening of the left
adrenal gland may represent adenomatous hyperplasia.

Skeleton:  No focal hypermetabolic activity to suggest skeletal
metastasis.
IMPRESSION: 1.  Small ill defined area of soft tissue prominence with low-level
increased metabolic activity in the upper outer quadrant of the
right breast, corresponding to the lesion noted on MRI 06/12/2012.
[DATE].  Postoperative changes in the right axilla related to recent
axillary nodal biopsy.  There is some hypermetabolism in this
region and a small postoperative fluid collection, which likely
represent a seroma.  This is a new baseline for future follow up
examinations.  No definite metastatic disease is noted within the
neck, chest, abdomen or pelvis on today's study.

3.  Low attenuation hepatic lesions, as above, likely represent
cysts.
4.  Probable adenomatous hyperplasia of the left adrenal gland.
5.  Borderline cardiomegaly.

## 2013-05-14 ENCOUNTER — Telehealth: Payer: Self-pay | Admitting: Oncology

## 2013-05-14 ENCOUNTER — Encounter: Payer: Self-pay | Admitting: Adult Health

## 2013-05-14 ENCOUNTER — Other Ambulatory Visit (HOSPITAL_BASED_OUTPATIENT_CLINIC_OR_DEPARTMENT_OTHER): Payer: BC Managed Care – PPO

## 2013-05-14 ENCOUNTER — Ambulatory Visit (HOSPITAL_BASED_OUTPATIENT_CLINIC_OR_DEPARTMENT_OTHER): Payer: BC Managed Care – PPO | Admitting: Adult Health

## 2013-05-14 VITALS — BP 113/74 | HR 46 | Temp 98.1°F | Resp 20 | Ht 63.0 in | Wt 125.2 lb

## 2013-05-14 DIAGNOSIS — C50411 Malignant neoplasm of upper-outer quadrant of right female breast: Secondary | ICD-10-CM

## 2013-05-14 DIAGNOSIS — C50419 Malignant neoplasm of upper-outer quadrant of unspecified female breast: Secondary | ICD-10-CM

## 2013-05-14 LAB — CBC WITH DIFFERENTIAL/PLATELET
BASO%: 0.6 % (ref 0.0–2.0)
EOS%: 2.2 % (ref 0.0–7.0)
HCT: 37.4 % (ref 34.8–46.6)
LYMPH%: 19.1 % (ref 14.0–49.7)
MCH: 32.3 pg (ref 25.1–34.0)
MCHC: 34.2 g/dL (ref 31.5–36.0)
MCV: 94.5 fL (ref 79.5–101.0)
MONO#: 0.6 10*3/uL (ref 0.1–0.9)
MONO%: 11.5 % (ref 0.0–14.0)
NEUT%: 66.6 % (ref 38.4–76.8)
Platelets: 205 10*3/uL (ref 145–400)
RBC: 3.96 10*6/uL (ref 3.70–5.45)
WBC: 5 10*3/uL (ref 3.9–10.3)

## 2013-05-14 LAB — COMPREHENSIVE METABOLIC PANEL (CC13)
ALT: 40 U/L (ref 0–55)
AST: 20 U/L (ref 5–34)
Alkaline Phosphatase: 81 U/L (ref 40–150)
CO2: 23 mEq/L (ref 22–29)
Creatinine: 0.8 mg/dL (ref 0.6–1.1)
Sodium: 142 mEq/L (ref 136–145)
Total Bilirubin: 0.41 mg/dL (ref 0.20–1.20)
Total Protein: 6.4 g/dL (ref 6.4–8.3)

## 2013-05-14 NOTE — Telephone Encounter (Signed)
gv pt appt schedule for January 2015. °

## 2013-05-14 NOTE — Patient Instructions (Signed)
Doing well.  No sign of recurrence.  Continue Tamoxifen daily.  We recommend an annual eye exam, and gynecologic exam, as well as having your cholesterol checked by your PCP.  Continue with diet and exercise.  We will see you back in 6 months.  Please call Cassie Carney if you have any questions or concerns.

## 2013-05-14 NOTE — Progress Notes (Signed)
OFFICE PROGRESS NOTE  CC  Cassie Moron, MD [address] [place] [address] Dr. Avel Carney Dr. Lurline Carney   DIAGNOSIS: 62 year old female with invasive lobular carcinoma of the right breast.  PRIOR THERAPY:  #1 patient was originally seen in the multidisciplinary breast clinic on a 8/28/ 2013 for a stage II a invasive lobular carcinoma that was ER positive.  #2 at the time it was recommended patient proceed with neoadjuvant chemotherapy. But did want A. Upfront sentinel lymph node biopsy and a Port-A-Cath placed.   #3she underwent treatment with neoadjuvant Taxotere and Cytoxan starting on 07/01/2012 through 10/15/2012. She completed a total of 6 cycles  #4Patient is now status post bilateral mastectomies performed on 11/17/2012. The final pathology revealed on the right 1.5 cm invasive grade 2 lobular carcinoma 4 sentinel nodes were negative for metastatic disease tumor was ER positive PR positive HER-2/neu negative. On the left side simple mastectomy was performed which did not reveal any malignancy but did show atypical lobular hyperplasia.  #5 patient has now completed radiation therapy to the right chest wall on 01/29/2013. Overall she tolerated it well without any problems.  #6 she will begin tamoxifen 20 mg daily starting 02/12/2013. Risks and benefits rationale were discussed. Total of 10 years of therapy is planned.  CURRENT THERAPY:  Tamoxifen 20 mg daily  INTERVAL HISTORY: Cassie Carney 62 y.o. female returns for follow up after three months of Tamoxifen.  She is tolerating the medication well.  She has tolerable hot flashes, but denies joint aches/dryness.  Her health maintenance was updated below.  Otherwise a 10 point ROS was neg.   MEDICAL HISTORY: Past Medical History  Diagnosis Date  . Mitral valve prolapse   . Night sweats   . Hot flashes   . PONV (postoperative nausea and vomiting)   . Anxiety   . Arthritis     fingers, feet,  elbow  . History of chemotherapy 07/01/12 -10/15/12    6 cycles  . Breast cancer 06/08/12    right, ER/PR +, Her 2 -  . History of radiation therapy 12/16/12-01/29/13    r chestwall/supraclavicular fossa  . Dysrhythmia     psvt  takes metoprolol to control rate    ALLERGIES:  is allergic to adhesive.  MEDICATIONS:  Current Outpatient Prescriptions  Medication Sig Dispense Refill  . ALPRAZolam (XANAX) 0.5 MG tablet Take 0.5-1 mg by mouth 4 (four) times daily as needed. For anxiety/insomnia      . Biotin 1 MG CAPS Take by mouth daily.      . CYANOCOBALAMIN PO Take 2,000 mg by mouth daily. Time release capsule      . hyaluronate sodium (RADIAPLEXRX) GEL Apply topically 2 (two) times daily.      . metoprolol succinate (TOPROL-XL) 25 MG 24 hr tablet Take 25 mg by mouth at bedtime.       . tamoxifen (NOLVADEX) 20 MG tablet Take 20 mg by mouth daily.       No current facility-administered medications for this visit.   Facility-Administered Medications Ordered in Other Visits  Medication Dose Route Frequency Provider Last Rate Last Dose  . sodium chloride 0.9 % injection 10 mL  10 mL Intracatheter PRN Victorino December, MD   10 mL at 09/02/12 1619    SURGICAL HISTORY:  Past Surgical History  Procedure Laterality Date  . Broken arm    . Broken wrist    . Dilation and curettage of uterus    .  Portacath placement  06/29/2012    Procedure: INSERTION PORT-A-CATH;  Surgeon: Adolph Pollack, MD;  Location: [facility];  Service: General;  Laterality: N/A;   ultrasound guided Port-A-Cath insertion  . External ear surgery      at early age ears pinned back  . Total mastectomy  11/17/2012    Procedure: R TOTAL MASTECTOMY;  Surgeon: Adolph Pollack, MD;  Location: [facility] OR;  Service: General;  Laterality: Left;  Marland Kitchen Mastectomy with axillary lymph node dissection  11/17/2012    Procedure: MASTECTOMY WITH AXILLARY LYMPH NODE DISSECTION;  Surgeon: Adolph Pollack, MD;  Location: [facility] OR;   Service: General;  Laterality: Right;  . Simple mastectomy  11/17/12    left-benign  . Port-a-cath removal N/A 02/26/2013    Procedure: REMOVAL PORT-A-CATH;  Surgeon: Adolph Pollack, MD;  Location: [facility] OR;  Service: General;  Laterality: N/A;    REVIEW OF SYSTEMS:   General: fatigue (-), night sweats (-), fever (-), pain (-) Lymph: palpable nodes (-) HEENT: vision changes (-), mucositis (-), gum bleeding (-), epistaxis (-) Cardiovascular: chest pain (-), palpitations (-) Pulmonary: shortness of breath (-), dyspnea on exertion (-), cough (-), hemoptysis (-) GI:  Early satiety (-), melena (-), dysphagia (-), nausea/vomiting (-), diarrhea (-) GU: dysuria (-), hematuria (-), incontinence (-) Musculoskeletal: joint swelling (-), joint pain (-), back pain (-) Neuro: weakness (-), numbness (-), headache (-), confusion (-) Skin: Rash (-), lesions (-), dryness (-) Psych: depression (-), suicidal/homicidal ideation (-), feeling of hopelessness (-)  Health Maintenance  Mammogram: n/a Colonoscopy: 5 years ago with 10 year f/u recommended Bone Density Scan: 5-6 years ago, normal Pap Smear: 03/2013 Eye Exam: due Vitamin D Level: unknown Lipid Panel: 2 years ago    PHYSICAL EXAMINATION:  BP 113/74  Pulse 46  Temp(Src) 98.1 F (36.7 C) (Oral)  Resp 20  Ht 5\' 3"  (1.6 m)  Wt 125 lb 3.2 oz (56.79 kg)  BMI 22.18 kg/m2 General: Patient is a well appearing female in no acute distress HEENT: PERRLA, sclerae anicteric no conjunctival pallor, MMM Neck: supple, no palpable adenopathy Lungs: clear to auscultation bilaterally, no wheezes, rhonchi, or rales Cardiovascular: regular rate rhythm, S1, S2, no murmurs, rubs or gallops Abdomen: Soft, non-tender, non-distended, normoactive bowel sounds, no HSM Extremities: warm and well perfused, no clubbing, cyanosis Skin: No rashes or lesions Neuro: Non-focal Bilateral mastectomy, incisions healed well, no nodularity or sign of recurrence.  ECOG  PERFORMANCE STATUS: 0 - Asymptomatic    LABORATORY DATA: Lab Results  Component Value Date   WBC 5.0 05/14/2013   HGB 12.8 05/14/2013   HCT 37.4 05/14/2013   MCV 94.5 05/14/2013   PLT 205 05/14/2013      Chemistry      Component Value Date/Time   NA 140 02/24/2013 1227   NA 142 02/12/2013 1330   K 4.1 02/24/2013 1227   K 3.8 02/12/2013 1330   CL 105 02/24/2013 1227   CL 108* 02/12/2013 1330   CO2 26 02/24/2013 1227   CO2 24 02/12/2013 1330   BUN 12 02/24/2013 1227   BUN 8.6 02/12/2013 1330   CREATININE 0.63 02/24/2013 1227   CREATININE 0.7 02/12/2013 1330      Component Value Date/Time   CALCIUM 9.8 02/24/2013 1227   CALCIUM 9.3 02/12/2013 1330   ALKPHOS 107 02/12/2013 1330   ALKPHOS 86 11/11/2012 1225   AST 23 02/12/2013 1330   AST 24 11/11/2012 1225   ALT 28 02/12/2013 1330  ALT 21 11/11/2012 1225   BILITOT 0.48 02/12/2013 1330   BILITOT 0.3 11/11/2012 1225     Diagnosis 1. Breast, simple mastectomy, Left - BREAST PARENCHYMA WITH NEOADJUVANT CHANGES AND ATYPICAL LOBULAR HYPERPLASIA. - BENIGN DUCTS WITH ASSOCIATED CALCIFICATION. - SEE COMMENT. 2. Breast, modified radical mastectomy , Right and Axillary Contents - MULTIFOCAL INVASIVE GRADE II LOBULAR CARCINOMA, LARGEST FOCUS SPANNING 1.5 CM IN GREATEST DIMENSION WITH ASSOCIATED ATYPICAL LOBULAR HYPERPLASIA. - TUMOR IS 0.1 CM FROM POSTERIOR DEEP MARGIN IN THE UPPER OUTER QUADRANT AND 0.15 CM FROM THE POSTERIOR DEEP MARGIN IN THE LOWER OUTER QUADRANT. - OTHER MARGINS ARE NEGATIVE. - FOUR BENIGN LYMPH NODES WITH NO TUMOR SEEN (0/4). - SEE ONCOLOGY TEMPLATE. Microscopic Comment 1. An E-cadherin immunohistochemical stain is performed on a single block in the left simple mastectomy which is negative between individual cells in a proliferative focus morphologically consistent with atypical lobular hyperplasia, confirming its presence. 2. BREAST, INVASIVE TUMOR, WITH LYMPH NODE SAMPLING Specimen, including laterality: Right breast and axillary  contents. Procedure: Right modified radical mastectomy with axillary contents. Grade: II. Tubule formation: 3. Nuclear pleomorphism: 2. Mitotic: 1. Tumor size (gross measurement): Largest focus measures 1.5 cm. Margins: Invasive, distance to closest margin: Invasive tumor is 0.1 cm from the deep posterior margin in the upper outer quadrant and 0.15 cm from the posterior margin in the lower outer quadrant. 1 of 3 FINAL for Cassie Carney, Cassie Carney ([id number]) Microscopic Comment(continued) Lymphovascular invasion: Although definitive lymphovascular invasion is not identified in the current specimen, a previous axillary lymph node was reported as positive (see previous case [id number]). Ductal carcinoma in situ: Not identified. Lobular neoplasia: Yes, atypical lobular hyperplasia present. Tumor focality: Multifocal. Treatment effect: Neoadjuvant change is identified within the breast parenchyma; however, multifocal invasive lobular carcinoma is present. As the lymph nodes present within the specimen do not show evidence of metastatic tumor and the previous case showed metastatic tumor in the lymph nodes ([id number]), there is a decrease in the nodal stage status post treatment. Therefore, the findings represent a partial response (PR). Extent of tumor: Tumor confined to breast parenchyma. Lymph nodes: # examined: 4. Lymph nodes with metastasis: 0. Breast prognostic profile: Performed on previous case [phone] [phone]. Estrogen receptor: 100%, positive. Progesterone receptor: 99%, positive. HER-2/neu by CISH: 0.71, no amplification. Ki-67: 18%, low. Non-neoplastic breast: Neoadjuvant changes. Benign ducts with associated calcification. TNM: ympT1c, ypN0, MX, see comments. Comments: Of note, no carcinoma is identified within the lymph nodes found in the specimen either by morphology or immunohistochemical staining. A cytokeratin AE1/AE3 stain is performed on all lymph node tissue (two stains  total), which fails to demonstrate evidence of metastatic carcinoma. Additional search for lymph nodes is performed through use of clearing solution with no additional lymph nodes identified. A cytokeratin AE1/AE3 immunohistochemical stain is also performed on three additional blocks of the mastectomy specimen to help visualize the relationship of the invasive lobular carcinoma to the margin. A HER-2/neu by CISH will be repeated on the current tumor and reported in an addendum. (RAH:eps 11/19/12)  RADIOGRAPHIC STUDIES:   ASSESSMENT: 62 year old female with  #1Stage II a (T2 N0) invasive lobular carcinoma of the right breast. Patient is s/p neoadjuvant chemotherapy consisting of Taxotere and Cytoxan administered from 07/01/12 - 10/15/12  #2. Patient is now status post bilateral mastectomies performed on 11/17/1998 413. The final pathology revealed on the right 1.5 cm invasive grade 2 lobular carcinoma 4 sentinel nodes were negative for metastatic disease tumor was ER positive PR positive HER-2/neu negative. On the  left side simple mastectomy was performed which did not reveal any malignancy but did show atypical lobular hyperplasia.  #3 status post radiation therapy to the right chest wall completed 01/29/2013  #4 tamoxifen 20 mg daily beginning 02/12/2013  PLAN:   #1 Continue tamoxifen 20 mg daily.  I counseled her on health maintenance and survivorship.    #2 she will return in 6 months time for followup.   All questions were answered. The patient knows to call the clinic with any problems, questions or concerns. We can certainly see the patient much sooner if necessary.  I spent 25 minutes counseling the patient face to face. The total time spent in the appointment was 30 minutes.  [name] Cassie Hollingshead, NP Medical Oncology [facility] Phone: [phone] 05/14/2013, 9:24 AM

## 2013-06-11 ENCOUNTER — Encounter (INDEPENDENT_AMBULATORY_CARE_PROVIDER_SITE_OTHER): Payer: Self-pay | Admitting: General Surgery

## 2013-06-11 ENCOUNTER — Ambulatory Visit (INDEPENDENT_AMBULATORY_CARE_PROVIDER_SITE_OTHER): Payer: BC Managed Care – PPO | Admitting: General Surgery

## 2013-06-11 VITALS — BP 100/60 | HR 64 | Temp 97.3°F | Resp 14 | Ht 63.0 in | Wt 122.2 lb

## 2013-06-11 DIAGNOSIS — Z853 Personal history of malignant neoplasm of breast: Secondary | ICD-10-CM

## 2013-06-11 NOTE — Patient Instructions (Signed)
Call if you notice any new nodules on your chest wall.

## 2013-06-11 NOTE — Progress Notes (Signed)
Procedure:  Right modified radical mastectomy. Left total mastectomy.  Date:  11/17/2012  Pathology: Right T2N1.  Left benign  History:  She presents for a long term follow up of her right breast cancer.  She is doing well.  She had XRT and chemotherapy. She is on Tamoxifen.  She denies any chest wall nodules or adenopathy.  Has some firmness in the right axilla. Exam: General- Is in NAD. Chest wall- Bilateral scars with no palpable nodules  Lymph nodes-no supraclavicular, cervical or axillary adenopathy  Right axillary area demonstrates some firmness consistent with muscular tendon and scar tissue but no mass.  Assessment:  T2N1 Right breast cancer status post right modified radical mastectomy and left total mastectomy-no clinical evidence of recurrence.  Plan:  RTC in 3 months.

## 2013-08-06 ENCOUNTER — Encounter (INDEPENDENT_AMBULATORY_CARE_PROVIDER_SITE_OTHER): Payer: Self-pay | Admitting: General Surgery

## 2013-08-25 ENCOUNTER — Encounter (INDEPENDENT_AMBULATORY_CARE_PROVIDER_SITE_OTHER): Payer: Self-pay | Admitting: General Surgery

## 2013-08-25 ENCOUNTER — Ambulatory Visit (INDEPENDENT_AMBULATORY_CARE_PROVIDER_SITE_OTHER): Payer: BC Managed Care – PPO | Admitting: General Surgery

## 2013-08-25 VITALS — BP 122/68 | HR 66 | Temp 97.4°F | Resp 14 | Ht 63.0 in | Wt 122.6 lb

## 2013-08-25 DIAGNOSIS — C50919 Malignant neoplasm of unspecified site of unspecified female breast: Secondary | ICD-10-CM

## 2013-08-25 DIAGNOSIS — C50911 Malignant neoplasm of unspecified site of right female breast: Secondary | ICD-10-CM

## 2013-08-25 NOTE — Progress Notes (Signed)
Procedure:  Right modified radical mastectomy. Left total mastectomy.  Date:  11/17/2012  Pathology: Right T2N1.  Left benign  History:  She presents for another long term follow up visit of her right breast cancer.  She is doing well.  She had XRT and chemotherapy. She is on Tamoxifen.  She denies any chest wall nodules or adenopathy.  The range of motion of her right arm is improving. Exam: General- Is in NAD. Chest wall- Bilateral scars with no palpable nodules  Lymph nodes-no supraclavicular, cervical or axillary adenopathy  Right axillary area firmness is less prominent.  Assessment:  T2N1 Right breast cancer status post right modified radical mastectomy and left total mastectomy-no clinical evidence of recurrence.  Plan:  RTC in 5 months.

## 2013-08-25 NOTE — Patient Instructions (Signed)
Call if you find any new nodules on the chest wall or in the underarm area.

## 2013-08-26 ENCOUNTER — Other Ambulatory Visit: Payer: Self-pay

## 2013-11-15 ENCOUNTER — Telehealth: Payer: Self-pay | Admitting: Oncology

## 2013-11-15 ENCOUNTER — Other Ambulatory Visit (HOSPITAL_BASED_OUTPATIENT_CLINIC_OR_DEPARTMENT_OTHER): Payer: BC Managed Care – PPO

## 2013-11-15 ENCOUNTER — Ambulatory Visit (HOSPITAL_BASED_OUTPATIENT_CLINIC_OR_DEPARTMENT_OTHER): Payer: BC Managed Care – PPO | Admitting: Oncology

## 2013-11-15 ENCOUNTER — Encounter: Payer: Self-pay | Admitting: Oncology

## 2013-11-15 VITALS — BP 117/61 | HR 59 | Temp 97.6°F | Resp 18 | Ht 63.0 in | Wt 122.0 lb

## 2013-11-15 DIAGNOSIS — C50419 Malignant neoplasm of upper-outer quadrant of unspecified female breast: Secondary | ICD-10-CM

## 2013-11-15 DIAGNOSIS — Z170 Estrogen receptor positive status [ER+]: Secondary | ICD-10-CM

## 2013-11-15 DIAGNOSIS — C50411 Malignant neoplasm of upper-outer quadrant of right female breast: Secondary | ICD-10-CM

## 2013-11-15 LAB — CBC WITH DIFFERENTIAL/PLATELET
BASO%: 0.7 % (ref 0.0–2.0)
Basophils Absolute: 0 10*3/uL (ref 0.0–0.1)
EOS%: 3 % (ref 0.0–7.0)
Eosinophils Absolute: 0.1 10*3/uL (ref 0.0–0.5)
HEMATOCRIT: 38.9 % (ref 34.8–46.6)
HGB: 13 g/dL (ref 11.6–15.9)
LYMPH#: 0.7 10*3/uL — AB (ref 0.9–3.3)
LYMPH%: 15.7 % (ref 14.0–49.7)
MCH: 31.3 pg (ref 25.1–34.0)
MCHC: 33.3 g/dL (ref 31.5–36.0)
MCV: 94 fL (ref 79.5–101.0)
MONO#: 0.4 10*3/uL (ref 0.1–0.9)
MONO%: 9.9 % (ref 0.0–14.0)
NEUT#: 3.2 10*3/uL (ref 1.5–6.5)
NEUT%: 70.7 % (ref 38.4–76.8)
Platelets: 198 10*3/uL (ref 145–400)
RBC: 4.14 10*6/uL (ref 3.70–5.45)
RDW: 12.9 % (ref 11.2–14.5)
WBC: 4.5 10*3/uL (ref 3.9–10.3)

## 2013-11-15 LAB — COMPREHENSIVE METABOLIC PANEL (CC13)
ALBUMIN: 3.7 g/dL (ref 3.5–5.0)
ALT: 28 U/L (ref 0–55)
ANION GAP: 9 meq/L (ref 3–11)
AST: 18 U/L (ref 5–34)
Alkaline Phosphatase: 66 U/L (ref 40–150)
BUN: 11.1 mg/dL (ref 7.0–26.0)
CALCIUM: 9.3 mg/dL (ref 8.4–10.4)
CO2: 25 meq/L (ref 22–29)
CREATININE: 0.8 mg/dL (ref 0.6–1.1)
Chloride: 108 mEq/L (ref 98–109)
Glucose: 109 mg/dl (ref 70–140)
Potassium: 4.1 mEq/L (ref 3.5–5.1)
SODIUM: 142 meq/L (ref 136–145)
Total Bilirubin: 0.41 mg/dL (ref 0.20–1.20)
Total Protein: 6.5 g/dL (ref 6.4–8.3)

## 2013-11-15 NOTE — Patient Instructions (Signed)
Breast Cancer Survivor Follow-Up Breast cancer begins when cells in the breast divide too rapidly. The extra cells form a lump (tumor). When the cancer is treated, the goal is to get rid of all cancer cells. However, sometimes a few cells survive. These cancer cells can then grow. They become recurrent cancer. This means the cancer comes back after treatment.  Most cases of recurrent breast cancer develop 3 to 5 years after treatment. However, sometimes it comes back just a few months after treatment. Other times, it does not come back until years later. If the cancer comes back in the same area as the first breast cancer, it is called a local recurrence. If the cancer comes back somewhere else in the body, it is called regional recurrence if the site is fairly near the breast or distant recurrence if it is far from the breast. Your caregiver may also use the term metastasize to indicate a cancer that has gone to another part of your body. Treatment is still possible after either kind of recurrence. The cancer can still be controlled.  CAUSES OF RECURRENT CANCER No one knows exactly why breast cancer starts in the first place. Why the cancer comes back after treatment is also not clear. It is known that certain conditions, called risk factors, can make this more likely. They include:  Developing breast cancer for the first time before age 60.  Having breast cancer that involves the lymph nodes. These are small, round pieces of tissue found all over the body. Their job is to help fight infections.  Having a large tumor. Cancer is more apt to come back if the first tumor was bigger than 2 inches (5 cm).  Having certain types of breast cancer, such as:  Inflammatory breast cancer. This rare type grows rapidly and causes the breast to become red and swollen.  A high-grade tumor. The grade of a tumor indicates how fast it will grow and spread. High-grade tumors grow more quickly than other types.  HER2  cancer. This refers to the tumor's genetic makeup. Tumors that have this type of gene are more likely to come back after treatment.  Having close tumor margins. This refers to the space between the tumor and normal, noncancerous cells. If the space is small, the tumor has a greater chance of coming back.  Having treatment involving a surgery to remove the tumor but not the entire breast (lumpectomy) and no radiation therapy. CARE AFTER BREAST CANCER Home Monitoring Women who have had breast cancer should continue to examine their breasts every month. The goal is to catch the cancer quickly if it comes back. Many women find it helpful to do so on the same day each month and to mark the calendar as a reminder. Let your caregiver know immediately if you have any signs of recurrent breast cancer. Symptoms will vary, depending on where the cancer recurs. The original type of treatment can also make a difference. Symptoms of local recurrence after a lumpectomy or a recurrence in the opposite breast may include:  A new lump or thickening in the breast.  A change in the way the skin looks on the breast (such as a rash, dimpling, or wrinkling).  Redness or swelling of the breast.  Changes in the nipple (such as being red, puckered, swollen, or leaking fluid). Symptoms of a recurrence after a breast removal surgery (mastectomy) may include:  A lump or thickening under the skin.  A thickening around the mastectomy scar. Symptoms   of regional recurrence in the lymph nodes near the breast may include:  A lump under the arm or above the collarbone.  Swelling of the arm.  Pain in the arm, shoulder, or chest.  Numbness in the hand or arm. Symptoms of distant recurrence may include:  A cough that does not go away.  Trouble breathing or shortness of breath.  Pain in the bones or the chest. This is pain that lasts or does not respond to rest and medicine.  Headaches.  Sudden vision  problems.  Dizziness.  Nausea or vomiting.  Losing weight without trying to.  Persistent abdominal pain.  Changes in bowel movements or blood in the stool.  Yellowing of the skin or eyes (jaundice).  Blood in the urine or bloody vaginal discharge. Clinical Monitoring  It is helpful to keep a schedule of appointments for needed tests and exams. This includes physical exams, breast exams, exams of the lymph nodes, and general exams.  For the first 3 years after being treated for breast cancer, see your caregiver every 3 to 6 months.  For years 4 and 5 after breast cancer, see your caregiver every 6 to 12 months.  After 5 years, see your caregiver at least once a year.  Regular breast X-rays (mammograms) should continue even if you had a mastectomy.  A mammogram should be done 1 year after the mammogram that first detected breast cancer.  A mammogram should be done every 6 to 12 months after that. Follow your caregiver's advice.  A pelvic exam done by your caregiver checks whether female organs are the normal size and shape. The exam is usually done every year. Ask your caregiver if that schedule is right for you.  Women taking tamoxifen should report any vaginal bleeding immediately to their caregiver. Tamoxifen is often given to women with a certain type of breast cancer. It has been shown to help prevent recurrence.  You will need to decide who your primary caregiver will be.  Most people continue to see their cancer specialist (oncologist) every 3 to 6 months for the first year after cancer treatment.  At some point, you may want to go back to seeing your family caregiver. You would no longer see your oncologist for regular checkups. Many women do this about 1 year after their first diagnosis of breast cancer.  You will still need to be seen every so often by your oncologist. Ask how often that should be. Coordinate this with your family or primary caregiver.  Think about  having genetic counseling. This would provide information on traits that can be passed or inherited from one generation to the next. In some cases, breast cancer runs in families. Tell your caregiver if you:  Are of Ashkenazi Jewish heritage.  Have any family member who has had ovarian cancer.  Have a mother, sister, or daughter who had breast cancer before age 7.  Have 2 or more close relatives who have had breast cancer. This means a mother, sister, daughter, aunt, or grandmother.  Had breast cancer in both breasts.  Have a female relative who has had breast cancer.  Some tests are not recommended for routine screening. Someone recovering from breast cancer does not need to have these tests if there are no problems. The tests have risks, such as radiation exposure, and can be costly. The risks of these tests are thought to be greater than the benefits:  Blood tests.  Chest X-rays.  Bone scans.  Liver ultrasound.  Computed tomography (CT scan).  Positron emission tomography (PET scan).  Magnetic resonance imaging (MRI scan). DIAGNOSIS OF RECURRENT CANCER Recurrent breast cancer may be suspected for various reasons. A mammogram may not look normal. You might feel a lump or have other symptoms. Your caregiver may find something unusual during an exam. To be sure, your caregiver will probably order some tests. The tests are needed because there are symptoms or hints of a problem. They could include:  Blood tests, including a test to check how well the liver is working. The liver is a common site for a distant cancer recurrence.  Imaging tests that create pictures of the inside of the body. These tests include:  Chest X-rays to show if the cancer has come back in the lungs.  CT scans to create detailed pictures of various areas of the body and help find a distant recurrence.  MRI scans to find anything unusual in the breast, chest, or lymph nodes.  Breast ultrasound tests to  examine the breasts.  Bone scans to create a picture of your whole skeleton and find cancer in bony areas.  PET scans to create an image of the whole body. PET scans can be used together with CT scans to show more detail.  Biopsy. A small sample of tissue is taken and checked under a microscope. If cancer cells are found, they may be tested to see if they contain the HER2 gene or the hormones estrogen and progesterone. This will help your caregiver decide how to treat the recurrent cancer. TREATMENT  How recurrent breast cancer is treated depends on where the new cancer is found. The type of treatment that was used for the first breast cancer makes a difference, too. A combination of treatments may be used. Options include:  Surgery.  If the cancer comes back in the breast that was not treated before, you may need a lumpectomy or mastectomy.  If the cancer comes back in the breast that was treated before, you may need a mastectomy.  The lymph nodes under the arm may need to be removed.  Radiation therapy.  For a local recurrence, radiation may be used if it was not used during the first treatment.  For a distance recurrence, radiation is sometimes used.  Chemotherapy.  This may be used before surgery to treat recurrent breast cancer.  This may be used to treat recurrent cancer that cannot be treated with surgery.  This may be used to treat a distant recurrence.  Hormone therapy.  Women with the HER2 gene may be given hormone therapy to attack this gene. Document Released: 06/05/2011 Document Revised: 12/30/2011 Document Reviewed: 06/05/2011 [employer] Patient Information 2014 [employer].  

## 2013-11-15 NOTE — Telephone Encounter (Signed)
, °

## 2013-11-15 NOTE — Progress Notes (Signed)
OFFICE PROGRESS NOTE  CC  [name], MD [address], [address] [place] [address] Dr. Jackolyn Confer Dr. Thea Silversmith   DIAGNOSIS: 63 year old female with invasive lobular carcinoma of the right breast.  PRIOR THERAPY:  #1 patient was originally seen in the multidisciplinary breast clinic on a 8/28/ 2013 for a stage II a invasive lobular carcinoma that was ER positive.  #2 at the time it was recommended patient proceed with neoadjuvant chemotherapy. But did want A. Upfront sentinel lymph node biopsy and a Port-A-Cath placed.   #3she underwent treatment with neoadjuvant Taxotere and Cytoxan starting on 07/01/2012 through 10/15/2012. She completed a total of 6 cycles  #4Patient is now status post bilateral mastectomies performed on 11/17/2012. The final pathology revealed on the right 1.5 cm invasive grade 2 lobular carcinoma 4 sentinel nodes were negative for metastatic disease tumor was ER positive PR positive HER-2/neu negative. On the left side simple mastectomy was performed which did not reveal any malignancy but did show atypical lobular hyperplasia.  #5 patient has now completed radiation therapy to the right chest wall on 01/29/2013. Overall she tolerated it well without any problems.  #6 she will begin tamoxifen 20 mg daily starting 02/12/2013. Risks and benefits rationale were discussed. Total of 10 years of therapy is planned.  CURRENT THERAPY:  Tamoxifen 20 mg daily  INTERVAL HISTORY: Cassie Carney 63 y.o. female returns for follow up after three months of Tamoxifen.  She is tolerating the medication well.  She has tolerable hot flashes, but denies joint aches/dryness.  Her health maintenance was updated below.  Otherwise a 10 point ROS was neg.   MEDICAL HISTORY: Past Medical History  Diagnosis Date  . Mitral valve prolapse   . Night sweats   . Hot flashes   . PONV (postoperative nausea and vomiting)   . Anxiety   . Arthritis     fingers, feet,  elbow  . History of chemotherapy 07/01/12 -10/15/12    6 cycles  . Breast cancer 06/08/12    right, ER/PR +, Her 2 -  . History of radiation therapy 12/16/12-01/29/13    r chestwall/supraclavicular fossa  . Dysrhythmia     psvt  takes metoprolol to control rate    ALLERGIES:  is allergic to adhesive.  MEDICATIONS:  Current Outpatient Prescriptions  Medication Sig Dispense Refill  . ALPRAZolam (XANAX) 0.5 MG tablet Take 0.5-1 mg by mouth 4 (four) times daily as needed. For anxiety/insomnia      . Biotin 1 MG CAPS Take by mouth daily.      . CYANOCOBALAMIN PO Take 2,000 mg by mouth daily. Time release capsule      . metoprolol succinate (TOPROL-XL) 25 MG 24 hr tablet Take 25 mg by mouth at bedtime.       . tamoxifen (NOLVADEX) 20 MG tablet Take 20 mg by mouth daily.       No current facility-administered medications for this visit.   Facility-Administered Medications Ordered in Other Visits  Medication Dose Route Frequency Provider Last Rate Last Dose  . sodium chloride 0.9 % injection 10 mL  10 mL Intracatheter PRN Deatra Robinson, MD   10 mL at 09/02/12 1619    SURGICAL HISTORY:  Past Surgical History  Procedure Laterality Date  . Broken arm    . Broken wrist    . Dilation and curettage of uterus    . Portacath placement  06/29/2012    Procedure: INSERTION PORT-A-CATH;  Surgeon: Rhunette Croft  Rosenbower, MD;  Location: [facility];  Service: General;  Laterality: N/A;   ultrasound guided Port-A-Cath insertion  . External ear surgery      at early age ears pinned back  . Total mastectomy  11/17/2012    Procedure: R TOTAL MASTECTOMY;  Surgeon: Odis Hollingshead, MD;  Location: [facility];  Service: General;  Laterality: Left;  Marland Kitchen Mastectomy with axillary lymph node dissection  11/17/2012    Procedure: MASTECTOMY WITH AXILLARY LYMPH NODE DISSECTION;  Surgeon: Odis Hollingshead, MD;  Location: [facility];  Service: General;  Laterality: Right;  . Simple mastectomy  11/17/12    left-benign   . Port-a-cath removal N/A 02/26/2013    Procedure: REMOVAL PORT-A-CATH;  Surgeon: Odis Hollingshead, MD;  Location: [facility];  Service: General;  Laterality: N/A;    REVIEW OF SYSTEMS:   General: fatigue (-), night sweats (-), fever (-), pain (-) Lymph: palpable nodes (-) HEENT: vision changes (-), mucositis (-), gum bleeding (-), epistaxis (-) Cardiovascular: chest pain (-), palpitations (-) Pulmonary: shortness of breath (-), dyspnea on exertion (-), cough (-), hemoptysis (-) GI:  Early satiety (-), melena (-), dysphagia (-), nausea/vomiting (-), diarrhea (-) GU: dysuria (-), hematuria (-), incontinence (-) Musculoskeletal: joint swelling (-), joint pain (-), back pain (-) Neuro: weakness (-), numbness (-), headache (-), confusion (-) Skin: Rash (-), lesions (-), dryness (-) Psych: depression (-), suicidal/homicidal ideation (-), feeling of hopelessness (-)  Health Maintenance  Mammogram: n/a Colonoscopy: 5 years ago with 10 year f/u recommended Bone Density Scan: 5-6 years ago, normal Pap Smear: 03/2013 Eye Exam: due Vitamin D Level: unknown Lipid Panel: 2 years ago    PHYSICAL EXAMINATION:  BP 117/61  Pulse 59  Temp(Src) 97.6 F (36.4 C) (Oral)  Resp 18  Ht _0  (1.6 m)  Wt 122 lb (55.339 kg)  BMI 21.62 kg/m2 General: Patient is a well appearing female in no acute distress HEENT: PERRLA, sclerae anicteric no conjunctival pallor, MMM Neck: supple, no palpable adenopathy Lungs: clear to auscultation bilaterally, no wheezes, rhonchi, or rales Cardiovascular: regular rate rhythm, S1, S2, no murmurs, rubs or gallops Abdomen: Soft, non-tender, non-distended, normoactive bowel sounds, no HSM Extremities: warm and well perfused, no clubbing, cyanosis Skin: No rashes or lesions Neuro: Non-focal Bilateral mastectomy, incisions healed well, no nodularity or sign of recurrence.  ECOG PERFORMANCE STATUS: 0 - Asymptomatic    LABORATORY DATA: Lab Results  Component Value Date    WBC 4.5 11/15/2013   HGB 13.0 11/15/2013   HCT 38.9 11/15/2013   MCV 94.0 11/15/2013   PLT 198 11/15/2013      Chemistry      Component Value Date/Time   NA 142 11/15/2013 1143   NA 140 02/24/2013 1227   K 4.1 11/15/2013 1143   K 4.1 02/24/2013 1227   CL 105 02/24/2013 1227   CL 108* 02/12/2013 1330   CO2 25 11/15/2013 1143   CO2 26 02/24/2013 1227   BUN 11.1 11/15/2013 1143   BUN 12 02/24/2013 1227   CREATININE 0.8 11/15/2013 1143   CREATININE 0.63 02/24/2013 1227      Component Value Date/Time   CALCIUM 9.3 11/15/2013 1143   CALCIUM 9.8 02/24/2013 1227   ALKPHOS 66 11/15/2013 1143   ALKPHOS 86 11/11/2012 1225   AST 18 11/15/2013 1143   AST 24 11/11/2012 1225   ALT 28 11/15/2013 1143   ALT 21 11/11/2012 1225   BILITOT 0.41 11/15/2013 1143   BILITOT 0.3 11/11/2012 1225  Diagnosis 1. Breast, simple mastectomy, Left - BREAST PARENCHYMA WITH NEOADJUVANT CHANGES AND ATYPICAL LOBULAR HYPERPLASIA. - BENIGN DUCTS WITH ASSOCIATED CALCIFICATION. - SEE COMMENT. 2. Breast, modified radical mastectomy , Right and Axillary Contents - MULTIFOCAL INVASIVE GRADE II LOBULAR CARCINOMA, LARGEST FOCUS SPANNING 1.5 CM IN GREATEST DIMENSION WITH ASSOCIATED ATYPICAL LOBULAR HYPERPLASIA. - TUMOR IS 0.1 CM FROM POSTERIOR DEEP MARGIN IN THE UPPER OUTER QUADRANT AND 0.15 CM FROM THE POSTERIOR DEEP MARGIN IN THE LOWER OUTER QUADRANT. - OTHER MARGINS ARE NEGATIVE. - FOUR BENIGN LYMPH NODES WITH NO TUMOR SEEN (0/4). - SEE ONCOLOGY TEMPLATE. Microscopic Comment 1. An E-cadherin immunohistochemical stain is performed on a single block in the left simple mastectomy which is negative between individual cells in a proliferative focus morphologically consistent with atypical lobular hyperplasia, confirming its presence. 2. BREAST, INVASIVE TUMOR, WITH LYMPH NODE SAMPLING Specimen, including laterality: Right breast and axillary contents. Procedure: Right modified radical mastectomy with axillary contents. Grade:  II. Tubule formation: 3. Nuclear pleomorphism: 2. Mitotic: 1. Tumor size (gross measurement): Largest focus measures 1.5 cm. Margins: Invasive, distance to closest margin: Invasive tumor is 0.1 cm from the deep posterior margin in the upper outer quadrant and 0.15 cm from the posterior margin in the lower outer quadrant. 1 of 3 FINAL for Cassie Carney, Cassie Carney ([id number]) Microscopic Comment(continued) Lymphovascular invasion: Although definitive lymphovascular invasion is not identified in the current specimen, a previous axillary lymph node was reported as positive (see previous case [id number]). Ductal carcinoma in situ: Not identified. Lobular neoplasia: Yes, atypical lobular hyperplasia present. Tumor focality: Multifocal. Treatment effect: Neoadjuvant change is identified within the breast parenchyma; however, multifocal invasive lobular carcinoma is present. As the lymph nodes present within the specimen do not show evidence of metastatic tumor and the previous case showed metastatic tumor in the lymph nodes ([id number]), there is a decrease in the nodal stage status post treatment. Therefore, the findings represent a partial response (PR). Extent of tumor: Tumor confined to breast parenchyma. Lymph nodes: # examined: 4. Lymph nodes with metastasis: 0. Breast prognostic profile: Performed on previous case [id number]. Estrogen receptor: 100%, positive. Progesterone receptor: 99%, positive. HER-2/neu by CISH: 0.71, no amplification. Ki-67: 18%, low. Non-neoplastic breast: Neoadjuvant changes. Benign ducts with associated calcification. TNM: ympT1c, ypN0, MX, see comments. Comments: Of note, no carcinoma is identified within the lymph nodes found in the specimen either by morphology or immunohistochemical staining. A cytokeratin AE1/AE3 stain is performed on all lymph node tissue (two stains total), which fails to demonstrate evidence of metastatic carcinoma. Additional search  for lymph nodes is performed through use of clearing solution with no additional lymph nodes identified. A cytokeratin AE1/AE3 immunohistochemical stain is also performed on three additional blocks of the mastectomy specimen to help visualize the relationship of the invasive lobular carcinoma to the margin. A HER-2/neu by CISH will be repeated on the current tumor and reported in an addendum. (RAH:eps 11/19/12)  RADIOGRAPHIC STUDIES:   ASSESSMENT: 63 year old female with  #1Stage II a (T2 N0) invasive lobular carcinoma of the right breast. Patient is s/p neoadjuvant chemotherapy consisting of Taxotere and Cytoxan administered from 07/01/12 - 10/15/12  #2. Patient is now status post bilateral mastectomies performed on 11/17/1998 413. The final pathology revealed on the right 1.5 cm invasive grade 2 lobular carcinoma 4 sentinel nodes were negative for metastatic disease tumor was ER positive PR positive HER-2/neu negative. On the left side simple mastectomy was performed which did not reveal any malignancy but did show atypical lobular hyperplasia.  #3  status post radiation therapy to the right chest wall completed 01/29/2013  #4 tamoxifen 20 mg daily beginning 02/12/2013. Tolerating well. No evidence of recurrence of  Breast cancer.  PLAN:    #1 Continue tamoxifen 20 mg daily.  I counseled her on health maintenance and survivorship.    #2 she will return in 6 months time for followup.   All questions were answered. The patient knows to call the clinic with any problems, questions or concerns. We can certainly see the patient much sooner if necessary.  I spent 15 minutes counseling the patient face to face. The total time spent in the appointment was 30 minutes.   Marcy Panning, MD Medical/Oncology [facility] [phone] (beeper) [phone] (Office)  11/15/2013, 12:25 PM

## 2014-01-25 ENCOUNTER — Encounter (INDEPENDENT_AMBULATORY_CARE_PROVIDER_SITE_OTHER): Payer: Self-pay | Admitting: General Surgery

## 2014-02-03 ENCOUNTER — Encounter (INDEPENDENT_AMBULATORY_CARE_PROVIDER_SITE_OTHER): Payer: Self-pay | Admitting: General Surgery

## 2014-02-03 ENCOUNTER — Ambulatory Visit (INDEPENDENT_AMBULATORY_CARE_PROVIDER_SITE_OTHER): Payer: BC Managed Care – PPO | Admitting: General Surgery

## 2014-02-03 VITALS — BP 110/65 | HR 53 | Temp 97.8°F | Resp 16 | Ht 63.0 in | Wt 123.0 lb

## 2014-02-03 DIAGNOSIS — C50919 Malignant neoplasm of unspecified site of unspecified female breast: Secondary | ICD-10-CM

## 2014-02-03 NOTE — Progress Notes (Signed)
Procedure:  Right modified radical mastectomy. Left total mastectomy.  Date:  11/17/2012  Pathology: Right T2N1.  Left benign  History:  She presents for a long term follow up visit of her right breast cancer.  She is doing well.  She had XRT and chemotherapy. She is on Tamoxifen.  She has an area on her right chest wall that is slightly more prominent than it is on the left side. Denies adenopathy. Exam: General- Is in NAD. Chest wall- Bilateral scars with no palpable nodules.  The area of concern is just a prominent area of her rib.  Lymph nodes-no supraclavicular, cervical or axillary adenopathy   Assessment:  T2N1 Right breast cancer status post right modified radical mastectomy and left total mastectomy-no clinical evidence of recurrence.  Plan:  RTC in 6 months.

## 2014-02-03 NOTE — Patient Instructions (Signed)
Call if you find any new lumps in your chest wall.

## 2014-03-02 ENCOUNTER — Other Ambulatory Visit: Payer: Self-pay | Admitting: Oncology

## 2014-04-28 ENCOUNTER — Telehealth: Payer: Self-pay | Admitting: Hematology and Oncology

## 2014-04-28 NOTE — Telephone Encounter (Signed)
, °

## 2014-05-04 ENCOUNTER — Other Ambulatory Visit: Payer: Self-pay

## 2014-05-04 DIAGNOSIS — C50411 Malignant neoplasm of upper-outer quadrant of right female breast: Secondary | ICD-10-CM

## 2014-05-04 MED ORDER — TAMOXIFEN CITRATE 20 MG PO TABS: 20.0000 mg | ORAL_TABLET | Freq: Every day | ORAL | Status: DC

## 2014-05-04 NOTE — Telephone Encounter (Signed)
Faxed 90 day prescription for Tamoxifen to [facility].  Sent to scan.   Cancelled remaining prescription with Kristopher Oppenheim by phone.

## 2014-05-18 ENCOUNTER — Ambulatory Visit: Payer: BC Managed Care – PPO | Admitting: Oncology

## 2014-05-18 ENCOUNTER — Other Ambulatory Visit: Payer: BC Managed Care – PPO

## 2014-05-30 ENCOUNTER — Other Ambulatory Visit: Payer: Self-pay | Admitting: *Deleted

## 2014-05-30 DIAGNOSIS — C50919 Malignant neoplasm of unspecified site of unspecified female breast: Secondary | ICD-10-CM

## 2014-05-31 ENCOUNTER — Ambulatory Visit (HOSPITAL_BASED_OUTPATIENT_CLINIC_OR_DEPARTMENT_OTHER): Payer: BC Managed Care – PPO | Admitting: Hematology

## 2014-05-31 ENCOUNTER — Other Ambulatory Visit (HOSPITAL_BASED_OUTPATIENT_CLINIC_OR_DEPARTMENT_OTHER): Payer: BC Managed Care – PPO

## 2014-05-31 ENCOUNTER — Encounter: Payer: Self-pay | Admitting: Hematology

## 2014-05-31 VITALS — BP 120/57 | HR 61 | Temp 98.1°F | Resp 18 | Ht 63.0 in | Wt 123.1 lb

## 2014-05-31 DIAGNOSIS — C50919 Malignant neoplasm of unspecified site of unspecified female breast: Secondary | ICD-10-CM

## 2014-05-31 DIAGNOSIS — Z170 Estrogen receptor positive status [ER+]: Secondary | ICD-10-CM

## 2014-05-31 DIAGNOSIS — C50419 Malignant neoplasm of upper-outer quadrant of unspecified female breast: Secondary | ICD-10-CM

## 2014-05-31 LAB — CBC WITH DIFFERENTIAL/PLATELET
BASO%: 0.4 % (ref 0.0–2.0)
Basophils Absolute: 0 10*3/uL (ref 0.0–0.1)
EOS%: 0.4 % (ref 0.0–7.0)
Eosinophils Absolute: 0 10*3/uL (ref 0.0–0.5)
HCT: 38.1 % (ref 34.8–46.6)
HGB: 12.7 g/dL (ref 11.6–15.9)
LYMPH%: 16.8 % (ref 14.0–49.7)
MCH: 31.3 pg (ref 25.1–34.0)
MCHC: 33.3 g/dL (ref 31.5–36.0)
MCV: 94.1 fL (ref 79.5–101.0)
MONO#: 0.5 10*3/uL (ref 0.1–0.9)
MONO%: 8.1 % (ref 0.0–14.0)
NEUT#: 4.5 10*3/uL (ref 1.5–6.5)
NEUT%: 74.3 % (ref 38.4–76.8)
Platelets: 213 10*3/uL (ref 145–400)
RBC: 4.06 10*6/uL (ref 3.70–5.45)
RDW: 12.9 % (ref 11.2–14.5)
WBC: 6.1 10*3/uL (ref 3.9–10.3)
lymph#: 1 10*3/uL (ref 0.9–3.3)

## 2014-05-31 LAB — COMPREHENSIVE METABOLIC PANEL (CC13)
ALBUMIN: 3.4 g/dL — AB (ref 3.5–5.0)
ALK PHOS: 53 U/L (ref 40–150)
ALT: 20 U/L (ref 0–55)
AST: 19 U/L (ref 5–34)
Anion Gap: 9 mEq/L (ref 3–11)
BUN: 14.3 mg/dL (ref 7.0–26.0)
CALCIUM: 8.9 mg/dL (ref 8.4–10.4)
CO2: 24 mEq/L (ref 22–29)
CREATININE: 1.3 mg/dL — AB (ref 0.6–1.1)
Chloride: 111 mEq/L — ABNORMAL HIGH (ref 98–109)
Glucose: 122 mg/dl (ref 70–140)
POTASSIUM: 4 meq/L (ref 3.5–5.1)
Sodium: 144 mEq/L (ref 136–145)
Total Bilirubin: <0.2 mg/dL (ref 0.20–1.20)
Total Protein: 6.3 g/dL — ABNORMAL LOW (ref 6.4–8.3)

## 2014-05-31 NOTE — Progress Notes (Deleted)
[facility] CONSULT NOTE  Patient Care Team: [name], MD as PCP - General (Internal Medicine)  CHIEF COMPLAINTS/PURPOSE OF CONSULTATION:  ***  HISTORY OF PRESENTING ILLNESS:  Cassie Carney 63 y.o. 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 The cancer was not palpable prior to diagnosis  I reviewed her records extensively and collaborated the history with the patient.  SUMMARY OF ONCOLOGIC HISTORY:  No history exists.    In terms of breast cancer risk profile:  She menarched at early age of *** and went to menopause at age ***  She had *** pregnancy, her first child was born at age ***  She did ***not breast-fed her child for approximately *** months.  She *** received birth control pills for approximately ***.  She was ***never exposed to fertility medications or hormone replacement therapy.  She has *** family history of Breast/GYN/GI cancer  INTERVAL HISTORY: Currently, she is healing well after surgery apart from mild discomfort. She has no concern for local infection  MEDICAL HISTORY:  Past Medical History  Diagnosis Date  . Mitral valve prolapse   . Night sweats   . Hot flashes   . PONV (postoperative nausea and vomiting)   . Anxiety   . Arthritis     fingers, feet, elbow  . History of chemotherapy 07/01/12 -10/15/12    6 cycles  . Breast cancer 06/08/12    right, ER/PR +, Her 2 -  . History of radiation therapy 12/16/12-01/29/13    r chestwall/supraclavicular fossa  . Dysrhythmia     psvt  takes metoprolol to control rate    SURGICAL HISTORY: Past Surgical History  Procedure Laterality Date  . Broken arm    . Broken wrist    . Dilation and curettage of uterus    . Portacath placement  06/29/2012    Procedure: INSERTION PORT-A-CATH;  Surgeon: Odis Hollingshead, MD;  Location: [facility];  Service: General;  Laterality: N/A;   ultrasound  guided Port-A-Cath insertion  . External ear surgery      at early age ears pinned back  . Total mastectomy  11/17/2012    Procedure: R TOTAL MASTECTOMY;  Surgeon: Odis Hollingshead, MD;  Location: [facility];  Service: General;  Laterality: Left;  Marland Kitchen Mastectomy with axillary lymph node dissection  11/17/2012    Procedure: MASTECTOMY WITH AXILLARY LYMPH NODE DISSECTION;  Surgeon: Odis Hollingshead, MD;  Location: [facility];  Service: General;  Laterality: Right;  . Simple mastectomy  11/17/12    left-benign  . Port-a-cath removal N/A 02/26/2013    Procedure: REMOVAL PORT-A-CATH;  Surgeon: Odis Hollingshead, MD;  Location: [facility];  Service: General;  Laterality: N/A;    SOCIAL HISTORY: History   Social History  . Marital Status: Married    Spouse Name: N/A    Number of Children: N/A  . Years of Education: N/A   Occupational History  . Not on file.   Social History Main Topics  . Smoking status: Never Smoker   . Smokeless tobacco: Never Used  . Alcohol Use: 4.2 oz/week    7 Glasses of wine per week  . Drug Use: No  . Sexual Activity: Yes     Comment: menarche age 24, 82st live birth age 82, no HRT   Other Topics Concern  . Not on file   Social History Narrative  . No narrative on file  FAMILY HISTORY: Family History  Problem Relation Age of Onset  . Cancer Father     squamous cell carcinoma  . Stroke Mother   . Hypertension Sister   . Stroke Maternal Grandmother     ALLERGIES:  is allergic to adhesive.  MEDICATIONS:  Current Outpatient Prescriptions  Medication Sig Dispense Refill  . ALPRAZolam (XANAX) 0.5 MG tablet Take 0.5-1 mg by mouth 4 (four) times daily as needed. For anxiety/insomnia      . Biotin 1 MG CAPS Take by mouth daily.      . CYANOCOBALAMIN PO Take 2,000 mg by mouth daily. Time release capsule      . metoprolol succinate (TOPROL-XL) 25 MG 24 hr tablet Take 25 mg by mouth at bedtime.       . tamoxifen (NOLVADEX) 20 MG tablet Take 1 tablet (20 mg total) by  mouth daily.  90 tablet  4   No current facility-administered medications for this visit.   Facility-Administered Medications Ordered in Other Visits  Medication Dose Route Frequency Provider Last Rate Last Dose  . sodium chloride 0.9 % injection 10 mL  10 mL Intracatheter PRN Deatra Robinson, MD   10 mL at 09/02/12 1619    REVIEW OF SYSTEMS:   Constitutional: Denies fevers, chills or abnormal night sweats Eyes: Denies blurriness of vision, double vision or watery eyes Ears, nose, mouth, throat, and face: Denies mucositis or sore throat Respiratory: Denies cough, dyspnea or wheezes Cardiovascular: Denies palpitation, chest discomfort or lower extremity swelling Gastrointestinal:  Denies nausea, heartburn or change in bowel habits Skin: Denies abnormal skin rashes Lymphatics: Denies new lymphadenopathy or easy bruising Neurological:Denies numbness, tingling or new weaknesses Behavioral/Psych: Mood is stable, no new changes  All other systems were reviewed with the patient and are negative.  Review of Systems  Constitutional: Positive for weight loss. Negative for fever.  HENT: Negative for sore throat.   Eyes: Negative for blurred vision.  Respiratory: Negative for cough and shortness of breath.   Cardiovascular: Negative.  Negative for chest pain, palpitations, orthopnea, leg swelling and PND.  Gastrointestinal: Negative for nausea, vomiting, diarrhea and constipation.  Genitourinary: Negative for dysuria and hematuria.  Musculoskeletal: Negative for back pain.  Skin: Negative for rash.  Neurological: Negative for focal weakness, weakness and headaches.  Endo/Heme/Allergies: Does not bruise/bleed easily.  Psychiatric/Behavioral: Negative for depression. The patient does not have insomnia.      PHYSICAL EXAMINATION: ECOG PERFORMANCE STATUS: {CHL ONC ECOG FI:[id number]}  Filed Vitals:   05/31/14 1545  BP: 120/57  Pulse: 61  Temp: 98.1 F (36.7 C)  Resp: 18   Filed  Weights   05/31/14 1545  Weight: 123 lb 1.6 oz (55.838 kg)    Physical Exam  Constitutional: She is oriented to person, place, and time. She appears well-developed and well-nourished.  HENT:  Head: Normocephalic and atraumatic.  Mouth/Throat: Oropharynx is clear and moist.  Eyes: Conjunctivae and EOM are normal. Pupils are equal, round, and reactive to light.  Neck: Normal range of motion. Neck supple.  Cardiovascular: Normal rate, regular rhythm and normal heart sounds.   Pulmonary/Chest: Effort normal and breath sounds normal. She has no wheezes. She has no rales.  Abdominal: Soft. Bowel sounds are normal. She exhibits no mass. There is no tenderness.  Musculoskeletal: Normal range of motion. She exhibits no edema.  Neurological: She is alert and oriented to person, place, and time. She has normal reflexes.  Skin: Skin is warm and dry.  Psychiatric: She has a  normal mood and affect. Her behavior is normal. Judgment and thought content normal.    LABORATORY DATA:  I have reviewed the data as listed Lab Results  Component Value Date   WBC 6.1 05/31/2014   HGB 12.7 05/31/2014   HCT 38.1 05/31/2014   MCV 94.1 05/31/2014   PLT 213 05/31/2014   Lab Results  Component Value Date   NA 142 11/15/2013   K 4.1 11/15/2013   CL 105 02/24/2013   CO2 25 11/15/2013    RADIOGRAPHIC STUDIES: I have personally reviewed the radiological images as listed and agreed with the findings in the report. No results found.  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 She is considered cured of disease.  Any form of adjuvant treatment is for prevention of disease recurrence.  She is currently undergoing assessment and treatment for adjuvant radiation therapy.  I plan to see her back in approximately 6 weeks for further discussion about the role of adjuvant endocrine therapy, in  view that her disease stained positive for estrogen receptor.  My plan would be to start her on adjuvant tamoxifen after she recovers from radiation treatment  #2 Bone health Her last DEXA scan was *** All questions were answered. The patient knows to call the clinic with any problems, questions or concerns. I spent {CHL ONC TIME VISIT - ENIDP:[id number]} counseling the patient face to face. The total time spent in the appointment was {CHL ONC TIME VISIT - ERXVQ:[id number]} and more than 50% was on counseling.     Glean Salvo, MD 05/31/2014 3:56 PM

## 2014-05-31 NOTE — Progress Notes (Signed)
OFFICE PROGRESS NOTE  CC  Cassie Poot, MD [address] [place] [address] Dr. Jackolyn Confer Dr. Thea Silversmith   DIAGNOSIS: 63 year old female with invasive lobular carcinoma of the right breast for follow up.  PRIOR THERAPY:  #1 patient was originally seen in the multidisciplinary breast clinic on a 06/17/2012 for a stage II a invasive lobular carcinoma that was ER positive.  #2 at the time it was recommended patient proceed with neoadjuvant chemotherapy. But did want an upfront sentinel lymph node biopsy and a Port-A-Cath placed.   #3 she underwent treatment with neoadjuvant Taxotere and Cytoxan starting on 07/01/2012 through 10/15/2012. She completed a total of 6 cycles with Dr Humphrey Rolls.  #4Patient underwent bilateral mastectomies performed on 11/17/2012 by Dr Zella Richer. The final pathology revealed on the right 1.5 cm invasive grade 2 lobular carcinoma 4 sentinel nodes were negative for metastatic disease tumor was ER positive PR positive HER-2/neu negative. On the left side simple mastectomy was performed which did not reveal any malignancy but did show atypical lobular hyperplasia.  #5 patient completed radiation therapy to the right chest wall on 01/29/2013. Overall she tolerated it well without any problems.  #6 she will begin tamoxifen 20 mg daily starting 02/12/2013. Risks and benefits rationale were discussed. Total of 10 years of therapy is planned.  CURRENT THERAPY:  Tamoxifen 20 mg daily  INTERVAL HISTORY: Cassie Carney 63 y.o. female returns for follow up after three months of Tamoxifen.  She is tolerating the medication well.  She has tolerable hot flashes, but denies joint aches/dryness.  Her health maintenance was updated below.  Otherwise a 10 point ROS was neg. She does get avaginal discharge sometimes but that was even there prior to starting tamoxifen. Her pap smear from last year was negative. She is due to see her gynecologist and have another  pap this year i.e. Dr Candiss Norse.  MEDICAL HISTORY: Past Medical History  Diagnosis Date  . Mitral valve prolapse   . Night sweats   . Hot flashes   . PONV (postoperative nausea and vomiting)   . Anxiety   . Arthritis     fingers, feet, elbow  . History of chemotherapy 07/01/12 -10/15/12    6 cycles  . Breast cancer 06/08/12    right, ER/PR +, Her 2 -  . History of radiation therapy 12/16/12-01/29/13    r chestwall/supraclavicular fossa  . Dysrhythmia     psvt  takes metoprolol to control rate    ALLERGIES:  is allergic to adhesive.  MEDICATIONS:  Current Outpatient Prescriptions  Medication Sig Dispense Refill  . ALPRAZolam (XANAX) 0.5 MG tablet Take 0.5-1 mg by mouth 4 (four) times daily as needed. For anxiety/insomnia      . Biotin 1 MG CAPS Take by mouth daily.      . CYANOCOBALAMIN PO Take 2,000 mg by mouth daily. Time release capsule      . metoprolol succinate (TOPROL-XL) 25 MG 24 hr tablet Take 25 mg by mouth at bedtime.       . tamoxifen (NOLVADEX) 20 MG tablet Take 1 tablet (20 mg total) by mouth daily.  90 tablet  4   No current facility-administered medications for this visit.   Facility-Administered Medications Ordered in Other Visits  Medication Dose Route Frequency Provider Last Rate Last Dose  . sodium chloride 0.9 % injection 10 mL  10 mL Intracatheter PRN Deatra Robinson, MD   10 mL at 09/02/12 1619    SURGICAL  HISTORY:  Past Surgical History  Procedure Laterality Date  . Broken arm    . Broken wrist    . Dilation and curettage of uterus    . Portacath placement  06/29/2012    Procedure: INSERTION PORT-A-CATH;  Surgeon: Odis Hollingshead, MD;  Location: [place];  Service: General;  Laterality: N/A;   ultrasound guided Port-A-Cath insertion  . External ear surgery      at early age ears pinned back  . Total mastectomy  11/17/2012    Procedure: R TOTAL MASTECTOMY;  Surgeon: Odis Hollingshead, MD;  Location: [place];  Service: General;  Laterality:  Left;  Marland Kitchen Mastectomy with axillary lymph node dissection  11/17/2012    Procedure: MASTECTOMY WITH AXILLARY LYMPH NODE DISSECTION;  Surgeon: Odis Hollingshead, MD;  Location: [place];  Service: General;  Laterality: Right;  . Simple mastectomy  11/17/12    left-benign  . Port-a-cath removal N/A 02/26/2013    Procedure: REMOVAL PORT-A-CATH;  Surgeon: Odis Hollingshead, MD;  Location: [place];  Service: General;  Laterality: N/A;    REVIEW OF SYSTEMS:   General: fatigue (-), night sweats (-), fever (-), pain (-) Lymph: palpable nodes (-) HEENT: vision changes (-), mucositis (-), gum bleeding (-), epistaxis (-) Cardiovascular: chest pain (-), palpitations (-) Pulmonary: shortness of breath (-), dyspnea on exertion (-), cough (-), hemoptysis (-) GI:  Early satiety (-), melena (-), dysphagia (-), nausea/vomiting (-), diarrhea (-) GU: dysuria (-), hematuria (-), incontinence (-) Musculoskeletal: joint swelling (-), joint pain (-), back pain (-) Neuro: weakness (-), numbness (-), headache (-), confusion (-) Skin: Rash (-), lesions (-), dryness (-) Psych: depression (-), suicidal/homicidal ideation (-), feeling of hopelessness (-)  Health Maintenance  Mammogram: n/a s/p double mastectomies. Colonoscopy: 5 years ago with 10 year f/u recommended Bone Density Scan: 5-6 years ago, normal Pap Smear: 03/2013 Eye Exam: due Vitamin D Level: unknown Lipid Panel: 2 years ago    PHYSICAL EXAMINATION:   BP 120/57  Pulse 61  Temp(Src) 98.1 F (36.7 C) (Oral)  Resp 18  Ht 5' 3"  (1.6 m)  Wt 123 lb 1.6 oz (55.838 kg)  BMI 21.81 kg/m2 General: Patient is a well appearing female in no acute distress HEENT: PERRLA, sclerae anicteric no conjunctival pallor, MMM Neck: supple, no palpable adenopathy Lungs: clear to auscultation bilaterally, no wheezes, rhonchi, or rales Cardiovascular: regular rate rhythm, S1, S2, no murmurs, rubs or gallops Abdomen: Soft, non-tender, non-distended, normoactive bowel  sounds, no HSM Extremities: warm and well perfused, no clubbing, cyanosis Skin: No rashes or lesions Neuro: Non-focal Bilateral mastectomy, incisions healed well, no nodularity or sign of recurrence. No Adenopathy. Exam done in presence of a nursing staff. ECOG PERFORMANCE STATUS: 0 - Asymptomatic    LABORATORY DATA:       Diagnosis 1. Breast, simple mastectomy, Left - BREAST PARENCHYMA WITH NEOADJUVANT CHANGES AND ATYPICAL LOBULAR HYPERPLASIA. - BENIGN DUCTS WITH ASSOCIATED CALCIFICATION. - SEE COMMENT. 2. Breast, modified radical mastectomy , Right and Axillary Contents - MULTIFOCAL INVASIVE GRADE II LOBULAR CARCINOMA, LARGEST FOCUSSPANNING 1.5 CM IN GREATEST DIMENSION WITH ASSOCIATED ATYPICAL LOBULAR HYPERPLASIA.- TUMOR IS 0.1 CM FROM POSTERIOR DEEP MARGIN IN THE UPPER OUTER QUADRANT AND 0.15 CM FROM THE POSTERIOR DEEP MARGIN IN THE LOWER OUTER QUADRANT. - OTHER MARGINS ARE NEGATIVE. - FOUR BENIGN LYMPH NODES WITH NO TUMOR SEEN (0/4). Microscopic Comment 1. An E-cadherin immunohistochemical stain is performed on a single block in the left simple mastectomy which is negative between individual  cells in a proliferative focus morphologically consistent with atypical lobular hyperplasia, confirming its presence. 2. BREAST, INVASIVE TUMOR, WITH LYMPH NODE SAMPLING Specimen, including laterality: Right breast and axillary contents.Procedure: Right modified radical mastectomy with axillary contents. Grade: II.Tubule formation: 3.Nuclear pleomorphism: 2.Mitotic: 1.Tumor size (gross measurement): Largest focus measures 1.5 cm. Margins:Invasive, distance to closest margin: Invasive tumor is 0.1 cm from the deep posterior margin in theupper outer quadrant and 0.15 cm from the posterior margin in the lower outer quadrant.1 of 3 FINAL for JATIA, MUSA ([id number]) Microscopic Comment(continued) Lymphovascular invasion: Although definitive lymphovascular invasion is not identified in the  currentspecimen, a previous axillary lymph node was reported as positive (see previous case [id number]).Ductal carcinoma in situ: Not identified.Lobular neoplasia: Yes, atypical lobular hyperplasia present.Tumor focality: Multifocal.Treatment effect: Neoadjuvant change is identified within the breast parenchyma; however, multifocalinvasive lobular carcinoma is present. As the lymph nodes present within the specimen do not show evidence of metastatic tumor and the previous case showed metastatic tumor in the lymph nodes ([id number]), there is a decrease in the nodal stage status post treatment. Therefore, the findings represent a partial response (PR).Extent of tumor: Tumor confined to breast parenchyma.Lymph nodes: # examined: 4.Lymph nodes with metastasis: 0.Breast prognostic profile: Performed on previous case [id number].Estrogen receptor: 100%, positive.Progesterone receptor: 99%, positive.HER-2/neu by CISH: 0.71, no amplification.Ki-67: 18%, low. Non-neoplastic breast: Neoadjuvant changes. Benign ducts with associated calcification. TNM: ympT1c, ypN0, MX, see comments. Comments: Of note, no carcinoma is identified within the lymph nodes found in the specimen either by morphology or immunohistochemical staining. A cytokeratin AE1/AE3 stain is performed on all lymph node tissue (two stains total), which fails to demonstrate evidence of metastatic carcinoma. Additional search for lymph nodes is performed through use of clearing solution with no additional lymph nodes identified. A cytokeratin AE1/AE3 immunohistochemical stain is also performed on three additional blocks of the mastectomy specimen to help visualize the relationship of the invasive lobular carcinoma to the margin. A HER-2/neu by CISH will be repeated on the current tumor and reported in an addendum. (RAH:eps 11/19/12)  ASSESSMENT: 63 year old female with  #1Stage II a (T2 N0) invasive lobular carcinoma of the right breast. Patient is  s/p neoadjuvant chemotherapy consisting of Taxotere and Cytoxan administered from 07/01/12 - 10/15/12  #2. Patient is now status post bilateral mastectomies performed on 11/17/1998 413. The final pathology revealed on the right 1.5 cm invasive grade 2 lobular carcinoma 4 sentinel nodes were negative for metastatic disease tumor was ER positive PR positive HER-2/neu negative. On the left side simple mastectomy was performed which did not reveal any malignancy but did show atypical lobular hyperplasia.  #3 status post radiation therapy to the right chest wall completed 01/29/2013  #4 tamoxifen 20 mg daily beginning 02/12/2013. Tolerating well. No evidence of recurrence of  Breast cancer.  PLAN:    #1 Today we had a long discussion about the choice of endocrine therapy. We talked about the two classes of drugs that is SERM and AI. Arimidex is more effective than Tamoxifen in post-menopausal women and does not have the side effect of causing DVT/PE/thromboembolic events or uterine malignancies that is uterine carcinoma or uterine sarcoma. It can cause bone loss and arthralgias and bone health needs to be monitored closely. Patient just got a 90 day supply of the Tamoxifen and after she finishes that, show would like to switch her therapy from Tamoxifen to Arimidex. Tamoxifen can cause hot flashes, vaginal dryness, mood changes, endometrial changes and risk for cancer, DVT, pulmonary embolism, cataracts,  gall stones etc. We will order a bone density test in 3 months and than see her back in beginning of November 2015. Her physical exam and labs were normal today.  #2 she will return in 3 months time for followup.   All questions were answered. The patient knows to call the clinic with any problems, questions or concerns. We can certainly see the patient much sooner if necessary.  I spent 25 minutes counseling the patient face to face. The total time spent in the appointment was 30 minutes.   Bernadene Bell, MD Medical Hematologist/Oncologist [place] Pager: [phone] Office No: [phone]  05/31/2014, 4:55 PM

## 2014-06-04 ENCOUNTER — Telehealth: Payer: Self-pay | Admitting: Hematology and Oncology

## 2014-06-04 NOTE — Telephone Encounter (Signed)
not able to reach pt by phone. pt is mychart active. message to [facility] scheduler to follow up w/scheduling dexa scan and confirm appts w/pt.

## 2014-06-07 ENCOUNTER — Telehealth: Payer: Self-pay | Admitting: Hematology and Oncology

## 2014-08-04 ENCOUNTER — Encounter (INDEPENDENT_AMBULATORY_CARE_PROVIDER_SITE_OTHER): Payer: Self-pay | Admitting: General Surgery

## 2014-08-04 NOTE — Progress Notes (Signed)
Patient ID: Cassie Carney, female   DOB: Dec 03, 1950, 63 y.o.   MRN: [id number]  Assessment: T2N1 Right breast cancer status post right modified radical mastectomy and left total mastectomy-no clinical evidence of recurrence.  Plan: RTC in May 2016.  Will be able to alternate with Dr. Lindi Adie so she is seen once every 6 months.

## 2014-08-23 ENCOUNTER — Ambulatory Visit: Payer: BC Managed Care – PPO | Admitting: Hematology and Oncology

## 2014-08-23 ENCOUNTER — Other Ambulatory Visit: Payer: BC Managed Care – PPO

## 2014-08-29 ENCOUNTER — Ambulatory Visit
Admission: RE | Admit: 2014-08-29 | Discharge: 2014-08-29 | Disposition: A | Discharge status: Home / Self Care | Payer: BC Managed Care – PPO | Source: Ambulatory Visit | Attending: Hematology | Admitting: Hematology

## 2014-08-29 DIAGNOSIS — C50919 Malignant neoplasm of unspecified site of unspecified female breast: Secondary | ICD-10-CM

## 2014-09-13 ENCOUNTER — Ambulatory Visit (HOSPITAL_BASED_OUTPATIENT_CLINIC_OR_DEPARTMENT_OTHER): Payer: BC Managed Care – PPO | Admitting: Hematology and Oncology

## 2014-09-13 ENCOUNTER — Other Ambulatory Visit (HOSPITAL_BASED_OUTPATIENT_CLINIC_OR_DEPARTMENT_OTHER): Payer: BC Managed Care – PPO

## 2014-09-13 ENCOUNTER — Telehealth: Payer: Self-pay | Admitting: Hematology and Oncology

## 2014-09-13 VITALS — BP 144/65 | HR 68 | Temp 98.2°F | Resp 18 | Ht 63.0 in | Wt 125.3 lb

## 2014-09-13 DIAGNOSIS — C50419 Malignant neoplasm of upper-outer quadrant of unspecified female breast: Secondary | ICD-10-CM

## 2014-09-13 DIAGNOSIS — C50412 Malignant neoplasm of upper-outer quadrant of left female breast: Secondary | ICD-10-CM

## 2014-09-13 DIAGNOSIS — Z170 Estrogen receptor positive status [ER+]: Secondary | ICD-10-CM

## 2014-09-13 DIAGNOSIS — C50919 Malignant neoplasm of unspecified site of unspecified female breast: Secondary | ICD-10-CM

## 2014-09-13 LAB — COMPREHENSIVE METABOLIC PANEL (CC13)
ALBUMIN: 3.5 g/dL (ref 3.5–5.0)
ALK PHOS: 83 U/L (ref 40–150)
ALT: 30 U/L (ref 0–55)
AST: 18 U/L (ref 5–34)
Anion Gap: 11 mEq/L (ref 3–11)
BUN: 13 mg/dL (ref 7.0–26.0)
CO2: 22 mEq/L (ref 22–29)
CREATININE: 0.8 mg/dL (ref 0.6–1.1)
Calcium: 9.2 mg/dL (ref 8.4–10.4)
Chloride: 106 mEq/L (ref 98–109)
GLUCOSE: 138 mg/dL (ref 70–140)
Potassium: 3.5 mEq/L (ref 3.5–5.1)
Sodium: 139 mEq/L (ref 136–145)
Total Bilirubin: 0.23 mg/dL (ref 0.20–1.20)
Total Protein: 6.6 g/dL (ref 6.4–8.3)

## 2014-09-13 LAB — CBC WITH DIFFERENTIAL/PLATELET
BASO%: 0.5 % (ref 0.0–2.0)
BASOS ABS: 0 10*3/uL (ref 0.0–0.1)
EOS ABS: 0 10*3/uL (ref 0.0–0.5)
EOS%: 0.3 % (ref 0.0–7.0)
HCT: 38.2 % (ref 34.8–46.6)
HEMOGLOBIN: 12.6 g/dL (ref 11.6–15.9)
LYMPH%: 10.8 % — AB (ref 14.0–49.7)
MCH: 30.3 pg (ref 25.1–34.0)
MCHC: 33 g/dL (ref 31.5–36.0)
MCV: 91.9 fL (ref 79.5–101.0)
MONO#: 0.5 10*3/uL (ref 0.1–0.9)
MONO%: 6.8 % (ref 0.0–14.0)
NEUT%: 81.6 % — AB (ref 38.4–76.8)
NEUTROS ABS: 6.4 10*3/uL (ref 1.5–6.5)
PLATELETS: 284 10*3/uL (ref 145–400)
RBC: 4.16 10*6/uL (ref 3.70–5.45)
RDW: 12.2 % (ref 11.2–14.5)
WBC: 7.9 10*3/uL (ref 3.9–10.3)
lymph#: 0.9 10*3/uL (ref 0.9–3.3)

## 2014-09-13 MED ORDER — ANASTROZOLE 1 MG PO TABS: 1.0000 mg | ORAL_TABLET | Freq: Every day | ORAL | Status: DC

## 2014-09-13 MED ORDER — IBANDRONATE SODIUM 150 MG PO TABS: 150.0000 mg | ORAL_TABLET | ORAL | Status: DC

## 2014-09-13 NOTE — Assessment & Plan Note (Signed)
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 ER/PR positive HER-2 negative status post neoadjuvant chemotherapy with Taxotere Cytoxan x6 cycles followed by bilateral mastectomies, 1.5 cm grade 2 lobular carcinoma 4 SLN negative, ER/PR positive HER-2 negative status post radiation therapy completed 01/29/2013 tamoxifen started 02/12/2013 changed to Arimidex 1 mg daily 09/13/2014  Surveillance: Today's chest wall exam did not reveal any lumps nodules or lymphadenopathy. Patient would need annual chest wall exams and followup. Aromatase inhibitor counseling: I discussed with her the differences between tamoxifen and aromatase inhibitors. Since there is severe ERPF aromatase inhibitors compared tamoxifen, I would strongly recommended. She had a bone density done recently which showed a T score of -2. I discussed the risks of aromatase inhibitors and probably causing osteoporosis. I believe that he feet treat her with oral calcium and vitamin D in addition to oral bisphosphonates, we can maintain her on aromatase inhibitor therapy. I discussed the other risks of aromatase inhibitors including the risk of hot flashes, myalgias, risk of endometrial cancer and DVT. Patient understands his risks and consented.  Return to clinic in 3 months for followup. I did not recommend routine blood work.

## 2014-09-13 NOTE — Telephone Encounter (Signed)
, °

## 2014-09-13 NOTE — Progress Notes (Signed)
Patient Care Team: [name], MD as PCP - General (Internal Medicine)  DIAGNOSIS: Primary cancer of upper outer quadrant of left female breast   Staging form: Breast, AJCC 7th Edition     Clinical: Stage IIA (T2, N1, cM0) - Unsigned       Staging comments: Staged at breast conference 8.28.13.      Pathologic: No stage assigned - Unsigned  DIAGNOSIS: 63 year old female with invasive lobular carcinoma of the right breast for follow up.  PRIOR THERAPY:  #1 patient was originally seen in the multidisciplinary breast clinic on a 06/17/2012 for a stage II a invasive lobular carcinoma that was ER positive.  #2 at the time it was recommended patient proceed with neoadjuvant chemotherapy. But did want an upfront sentinel lymph node biopsy and a Port-A-Cath placed.   #3 she underwent treatment with neoadjuvant Taxotere and Cytoxan starting on 07/01/2012 through 10/15/2012. She completed a total of 6 cycles with Dr Humphrey Rolls.  #4Patient underwent bilateral mastectomies performed on 11/17/2012 by Dr Zella Richer. The final pathology revealed on the right 1.5 cm invasive grade 2 lobular carcinoma 4 sentinel nodes were negative for metastatic disease tumor was ER positive PR positive HER-2/neu negative. On the left side simple mastectomy was performed which did not reveal any malignancy but did show atypical lobular hyperplasia.  #5 patient completed radiation therapy to the right chest wall on 01/29/2013. Overall she tolerated it well without any problems.  #6 tamoxifen 20 mg daily started 02/12/2013. Changed to Arimidex 1 mg daily 09/13/2014   INTERVAL HISTORY: Cassie Carney is a 63 year old Caucasian with above-mentioned history of breast cancer that was treated with bilateral mastectomies after neoadjuvant chemotherapy. She underwent radiation therapy followed by tamoxifen. She has been on tamoxifen for a year and half and she met with Dr. Lona Kettle who discussed with her about switching her treatment  from tamoxifen to aromatase inhibitors. She had a recent bone density test and is here to discuss those results as well. 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  REVIEW OF SYSTEMS:   Constitutional: Denies fevers, chills or abnormal weight loss Eyes: Denies blurriness of vision Ears, nose, mouth, throat, and face: Denies mucositis or sore throat Respiratory: Denies cough, dyspnea or wheezes Cardiovascular: Denies palpitation, chest discomfort or lower extremity swelling Gastrointestinal:  Denies nausea, heartburn or change in bowel habits Skin: Denies abnormal skin rashes Lymphatics: Denies new lymphadenopathy or easy bruising Neurological:Denies numbness, tingling or new weaknesses Behavioral/Psych: Mood is stable, no new changes  Breast: denies any nodules or lumps in the chest wall All other systems were reviewed with the patient and are negative.  I have reviewed the past medical history, past surgical history, social history and family history with the patient and they are unchanged from previous note.  ALLERGIES:  is allergic to adhesive.  MEDICATIONS:  Current Outpatient Prescriptions  Medication Sig Dispense Refill  . ALPRAZolam (XANAX) 0.5 MG tablet Take 0.5-1 mg by mouth 4 (four) times daily as needed. For anxiety/insomnia    . aspirin 81 MG tablet Take 81 mg by mouth daily.    . beclomethasone (QVAR) 80 MCG/ACT inhaler Inhale 2 puffs into the lungs 2 (two) times daily.    . Biotin 1 MG CAPS Take by mouth daily.    . CYANOCOBALAMIN PO Take 2,000 mg by mouth daily. Time release capsule    . metoprolol succinate (TOPROL-XL) 25 MG 24 hr tablet Take 25 mg by mouth at bedtime.     Marland Kitchen anastrozole (  ARIMIDEX) 1 MG tablet Take 1 tablet (1 mg total) by mouth daily. 90 tablet 3  . ibandronate (BONIVA) 150 MG tablet Take 1 tablet (150 mg total) by mouth every 30 (thirty) days. Take in the morning with a full glass of water, on an empty stomach, and do not take anything else  by mouth or lie down for the next 30 min. 3 tablet 3   No current facility-administered medications for this visit.   Facility-Administered Medications Ordered in Other Visits  Medication Dose Route Frequency Provider Last Rate Last Dose  . sodium chloride 0.9 % injection 10 mL  10 mL Intracatheter PRN Deatra Robinson, MD   10 mL at 09/02/12 1619    PHYSICAL EXAMINATION: ECOG PERFORMANCE STATUS: 0 - Asymptomatic  Filed Vitals:   09/13/14 1447  BP: 144/65  Pulse: 68  Temp: 98.2 F (36.8 C)  Resp: 18   Filed Weights   09/13/14 1447  Weight: 125 lb 4.8 oz (56.836 kg)    GENERAL:alert, no distress and comfortable SKIN: skin color, texture, turgor are normal, no rashes or significant lesions EYES: normal, Conjunctiva are pink and non-injected, sclera clear OROPHARYNX:no exudate, no erythema and lips, buccal mucosa, and tongue normal  NECK: supple, thyroid normal size, non-tender, without nodularity LYMPH:  no palpable lymphadenopathy in the cervical, axillary or inguinal LUNGS: clear to auscultation and percussion with normal breathing effort HEART: regular rate & rhythm and no murmurs and no lower extremity edema ABDOMEN:abdomen soft, non-tender and normal bowel sounds Musculoskeletal:no cyanosis of digits and no clubbing  NEURO: alert & oriented x 3 with fluent speech, no focal motor/sensory deficits BREAST:no palpable abnormalities in the chest wall or axilla  LABORATORY DATA:  I have reviewed the data as listed   Chemistry      Component Value Date/Time   NA 139 09/13/2014 1404   NA 140 02/24/2013 1227   K 3.5 09/13/2014 1404   K 4.1 02/24/2013 1227   CL 105 02/24/2013 1227   CL 108* 02/12/2013 1330   CO2 22 09/13/2014 1404   CO2 26 02/24/2013 1227   BUN 13.0 09/13/2014 1404   BUN 12 02/24/2013 1227   CREATININE 0.8 09/13/2014 1404   CREATININE 0.63 02/24/2013 1227      Component Value Date/Time   CALCIUM 9.2 09/13/2014 1404   CALCIUM 9.8 02/24/2013 1227    ALKPHOS 83 09/13/2014 1404   ALKPHOS 86 11/11/2012 1225   AST 18 09/13/2014 1404   AST 24 11/11/2012 1225   ALT 30 09/13/2014 1404   ALT 21 11/11/2012 1225   BILITOT 0.23 09/13/2014 1404   BILITOT 0.3 11/11/2012 1225       Lab Results  Component Value Date   WBC 7.9 09/13/2014   HGB 12.6 09/13/2014   HCT 38.2 09/13/2014   MCV 91.9 09/13/2014   PLT 284 09/13/2014   NEUTROABS 6.4 09/13/2014     RADIOGRAPHIC STUDIES: I have personally reviewed the radiology reports and agreed with their findings. Bone density showed a T score of -2 osteopenia  ASSESSMENT & PLAN:  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 ER/PR positive HER-2 negative status post neoadjuvant chemotherapy with Taxotere Cytoxan x6 cycles followed by bilateral mastectomies, 1.5 cm grade 2 lobular carcinoma 4 SLN negative, ER/PR positive HER-2 negative status post radiation therapy completed 01/29/2013 tamoxifen started 02/12/2013 changed to Arimidex 1 mg daily 09/13/2014  Surveillance: Today's chest wall  exam did not reveal any lumps nodules or lymphadenopathy. Patient would need annual chest wall exams and followup. Aromatase inhibitor counseling: I discussed with her the differences between tamoxifen and aromatase inhibitors. Since there is severe ERPF aromatase inhibitors compared tamoxifen, I would strongly recommended. She had a bone density done recently which showed a T score of -2. I discussed the risks of aromatase inhibitors and probably causing osteoporosis. I believe that he feet treat her with oral calcium and vitamin D in addition to oral bisphosphonates, we can maintain her on aromatase inhibitor therapy. I discussed the other risks of aromatase inhibitors including the risk of hot flashes, myalgias, risk of endometrial cancer and DVT. Patient understands his risks and consented.  Return to clinic in 3 months for  followup. I did not recommend routine blood work.   No orders of the defined types were placed in this encounter.   The patient has a good understanding of the overall plan. she agrees with it. She will call with any problems that may develop before her next visit here.   Rulon Eisenmenger, MD 09/13/2014 3:35 PM

## 2014-09-19 ENCOUNTER — Other Ambulatory Visit: Payer: Self-pay | Admitting: *Deleted

## 2014-09-19 DIAGNOSIS — C50419 Malignant neoplasm of upper-outer quadrant of unspecified female breast: Secondary | ICD-10-CM

## 2014-09-19 MED ORDER — IBANDRONATE SODIUM 150 MG PO TABS: 150.0000 mg | ORAL_TABLET | ORAL | Status: DC

## 2014-11-25 ENCOUNTER — Telehealth: Payer: Self-pay | Admitting: Hematology and Oncology

## 2014-11-25 ENCOUNTER — Ambulatory Visit (HOSPITAL_BASED_OUTPATIENT_CLINIC_OR_DEPARTMENT_OTHER): Payer: BLUE CROSS/BLUE SHIELD | Admitting: Hematology and Oncology

## 2014-11-25 VITALS — BP 120/75 | HR 60 | Temp 97.7°F | Resp 18 | Ht 63.0 in | Wt 126.7 lb

## 2014-11-25 DIAGNOSIS — C50411 Malignant neoplasm of upper-outer quadrant of right female breast: Secondary | ICD-10-CM

## 2014-11-25 DIAGNOSIS — C50412 Malignant neoplasm of upper-outer quadrant of left female breast: Secondary | ICD-10-CM

## 2014-11-25 DIAGNOSIS — Z170 Estrogen receptor positive status [ER+]: Secondary | ICD-10-CM

## 2014-11-25 DIAGNOSIS — M8580 Other specified disorders of bone density and structure, unspecified site: Secondary | ICD-10-CM

## 2014-11-25 NOTE — Progress Notes (Signed)
Patient Care Team: [name], MD as PCP - General (Internal Medicine)  DIAGNOSIS: Primary cancer of upper outer quadrant of left female breast   Staging form: Breast, AJCC 7th Edition     Clinical: Stage IIA (T2, N1, cM0) - Unsigned       Staging comments: Staged at breast conference 8.28.13.      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, grade 2, 4 sentinel nodes negative, ER positive PR positive HER-2 negative;  left breast mastectomy no cancer [facility]   12/29/2012 - 01/29/2013 Radiation Therapy  adjuvant radiation therapy   02/12/2013 -  Anti-estrogen oral therapy  tamoxifen 20 mg daily started 02/12/2013, changed to Arimidex 1 mg daily 09/13/2014    CHIEF COMPLIANT: follow-up on Arimidex and Boniva  INTERVAL HISTORY: RAJANAE Carney is a 64 year old lady with above-mentioned history of left-sided breast cancer treated with neoadjuvant chemotherapy followed by bilateral mastectomies adjuvant radiation and is currently on antiestrogen therapy. I switched her from tamoxifen to Arimidex. She is been tolerating Arimidex fairly well without any new problems or concerns. She has not noticed any major difference. She was found to have osteopenia with a T score of -2 and was started on Boniva with calcium and vitamin D. She has been tolerating Boniva fairly well.  REVIEW OF SYSTEMS:   Constitutional: Denies fevers, chills or abnormal weight loss Eyes: Denies blurriness of vision Ears, nose, mouth, throat, and face: Denies mucositis or sore throat Respiratory: Denies cough, dyspnea or wheezes Cardiovascular: Denies palpitation, chest discomfort or lower extremity swelling Gastrointestinal:  Denies nausea, heartburn or change in bowel  habits Skin: Denies abnormal skin rashes Lymphatics: Denies new lymphadenopathy or easy bruising Neurological:Denies numbness, tingling or new weaknesses Behavioral/Psych: Mood is stable, no new changes  Breast:  denies any pain or lumps or nodules in chest wall All other systems were reviewed with the patient and are negative.  I have reviewed the past medical history, past surgical history, social history and family history with the patient and they are unchanged from previous note.  ALLERGIES:  is allergic to adhesive.  MEDICATIONS:  Current Outpatient Prescriptions  Medication Sig Dispense Refill  . ALPRAZolam (XANAX) 0.5 MG tablet Take 0.5-1 mg by mouth 4 (four) times daily as needed. For anxiety/insomnia    . anastrozole (ARIMIDEX) 1 MG tablet Take 1 tablet (1 mg total) by mouth daily. 90 tablet 3  . aspirin 81 MG tablet Take 81 mg by mouth daily.    . Biotin 1 MG CAPS Take by mouth daily.    . CYANOCOBALAMIN PO Take 2,000 mg by mouth daily. Time release capsule    . ibandronate (BONIVA) 150 MG tablet Take 1 tablet (150 mg total) by mouth every 30 (thirty) days. In AM w/glass of H2O on empty stomach Nothing oral & dont lie down for 60 min 3 tablet 3  . metoprolol succinate (TOPROL-XL) 25 MG 24 hr tablet Take 25 mg by mouth at bedtime.      No current facility-administered medications for this visit.   Facility-Administered Medications Ordered in Other Visits  Medication Dose Route Frequency Provider Last Rate Last Dose  . sodium chloride 0.9 % injection 10 mL  10 mL Intracatheter PRN Deatra Robinson, MD  10 mL at 09/02/12 1619    PHYSICAL EXAMINATION: ECOG PERFORMANCE STATUS: 1 - Symptomatic but completely ambulatory  Filed Vitals:   11/25/14 1039  BP: 120/75  Pulse: 60  Temp: 97.7 F (36.5 C)  Resp: 18   Filed Weights   11/25/14 1039  Weight: 126 lb 11.2 oz (57.471 kg)    GENERAL:alert, no distress and comfortable SKIN: skin color, texture, turgor are normal, no  rashes or significant lesions EYES: normal, Conjunctiva are pink and non-injected, sclera clear OROPHARYNX:no exudate, no erythema and lips, buccal mucosa, and tongue normal  NECK: supple, thyroid normal size, non-tender, without nodularity LYMPH:  no palpable lymphadenopathy in the cervical, axillary or inguinal LUNGS: clear to auscultation and percussion with normal breathing effort HEART: regular rate & rhythm and no murmurs and no lower extremity edema ABDOMEN:abdomen soft, non-tender and normal bowel sounds Musculoskeletal:no cyanosis of digits and no clubbing  NEURO: alert & oriented x 3 with fluent speech, no focal motor/sensory deficits  LABORATORY DATA:  I have reviewed the data as listed   Chemistry      Component Value Date/Time   NA 139 09/13/2014 1404   NA 140 02/24/2013 1227   K 3.5 09/13/2014 1404   K 4.1 02/24/2013 1227   CL 105 02/24/2013 1227   CL 108* 02/12/2013 1330   CO2 22 09/13/2014 1404   CO2 26 02/24/2013 1227   BUN 13.0 09/13/2014 1404   BUN 12 02/24/2013 1227   CREATININE 0.8 09/13/2014 1404   CREATININE 0.63 02/24/2013 1227      Component Value Date/Time   CALCIUM 9.2 09/13/2014 1404   CALCIUM 9.8 02/24/2013 1227   ALKPHOS 83 09/13/2014 1404   ALKPHOS 86 11/11/2012 1225   AST 18 09/13/2014 1404   AST 24 11/11/2012 1225   ALT 30 09/13/2014 1404   ALT 21 11/11/2012 1225   BILITOT 0.23 09/13/2014 1404   BILITOT 0.3 11/11/2012 1225       Lab Results  Component Value Date   WBC 7.9 09/13/2014   HGB 12.6 09/13/2014   HCT 38.2 09/13/2014   MCV 91.9 09/13/2014   PLT 284 09/13/2014   NEUTROABS 6.4 09/13/2014    ASSESSMENT & PLAN:  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 Osteopenia with risk factors: Bone density showed osteopenia and patient is currently on Boniva with calcium and vitamin D. Return to clinic in 6 months for follow-up  No orders of the defined types were placed in this encounter.   The patient has a good understanding of the overall plan. she agrees with it. She will call with any problems that may develop before her next visit here.   Rulon Eisenmenger, MD

## 2014-11-25 NOTE — Telephone Encounter (Signed)
, °

## 2014-11-25 NOTE — Assessment & Plan Note (Signed)
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 Osteopenia with risk factors: Bone density showed osteopenia and patient is currently on Boniva with calcium and vitamin D. Return to clinic in 6 months for follow-up

## 2015-04-21 ENCOUNTER — Telehealth: Payer: Self-pay | Admitting: Hematology and Oncology

## 2015-04-21 NOTE — Telephone Encounter (Signed)
Called and left a message with a new appointment due to dr Lindi Adie out of the office 8/5

## 2015-05-25 ENCOUNTER — Telehealth: Payer: Self-pay | Admitting: Hematology and Oncology

## 2015-05-25 ENCOUNTER — Ambulatory Visit (HOSPITAL_BASED_OUTPATIENT_CLINIC_OR_DEPARTMENT_OTHER): Payer: BLUE CROSS/BLUE SHIELD | Admitting: Hematology and Oncology

## 2015-05-25 ENCOUNTER — Encounter: Payer: Self-pay | Admitting: Hematology and Oncology

## 2015-05-25 VITALS — BP 135/70 | HR 46 | Temp 99.1°F | Resp 17 | Ht 63.0 in | Wt 126.6 lb

## 2015-05-25 DIAGNOSIS — Z853 Personal history of malignant neoplasm of breast: Secondary | ICD-10-CM

## 2015-05-25 DIAGNOSIS — M899 Disorder of bone, unspecified: Secondary | ICD-10-CM

## 2015-05-25 DIAGNOSIS — C50412 Malignant neoplasm of upper-outer quadrant of left female breast: Secondary | ICD-10-CM

## 2015-05-25 NOTE — Assessment & Plan Note (Signed)
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 Osteopenia with risk factors: Bone density showed osteopenia and patient is currently on Boniva with calcium and vitamin D.  Breast Cancer Surveillance: 1.  chest exam  05/25/2015: Normal 2. Mammogram  No role of mammograms since she had bilateral mastectomies  Return to clinic in 6 months for follow-up  And after that we will see her once a year.

## 2015-05-25 NOTE — Telephone Encounter (Signed)
Gave avs & calendar for February 2017. °

## 2015-05-25 NOTE — Progress Notes (Signed)
Patient Care Team: [name], MD as PCP - General (Internal Medicine)  DIAGNOSIS: Primary cancer of upper outer quadrant of left female breast   Staging form: Breast, AJCC 7th Edition     Clinical: Stage IIA (T2, N1, cM0) - Unsigned       Staging comments: Staged at breast conference 8.28.13.      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, grade 2, 4 sentinel nodes negative, ER positive PR positive HER-2 negative;  left breast mastectomy no cancer [facility]   12/29/2012 - 01/29/2013 Radiation Therapy  adjuvant radiation therapy   02/12/2013 -  Anti-estrogen oral therapy  tamoxifen 20 mg daily started 02/12/2013, changed to Arimidex 1 mg daily January 2016    CHIEF COMPLIANT: Follow-up on tamoxifen  INTERVAL HISTORY: Cassie Carney is a 64 year old with above-mentioned history of left breast cancer treated with neo adjuvant chemotherapy followed by bilateral mastectomies adjuvant radiation is currently on Arimidex since January 2016. She is tolerating this treatment extremely well without any major problems or concerns. She does have occasional stiffness in hands. But she exercises regularly.  REVIEW OF SYSTEMS:   Constitutional: Denies fevers, chills or abnormal weight loss Eyes: Denies blurriness of vision Ears, nose, mouth, throat, and face: Denies mucositis or sore throat Respiratory: Denies cough, dyspnea or wheezes Cardiovascular: Denies palpitation, chest discomfort or lower extremity swelling Gastrointestinal:  Denies nausea, heartburn or change in bowel habits Skin: Denies abnormal skin rashes Lymphatics: Denies new lymphadenopathy or easy bruising Neurological:Denies numbness, tingling or new weaknesses Behavioral/Psych: Mood  is stable, no new changes  Breast:  denies any pain or lumps or nodules in either breasts All other systems were reviewed with the patient and are negative.  I have reviewed the past medical history, past surgical history, social history and family history with the patient and they are unchanged from previous note.  ALLERGIES:  is allergic to adhesive.  MEDICATIONS:  Current Outpatient Prescriptions  Medication Sig Dispense Refill  . ALPRAZolam (XANAX) 0.5 MG tablet Take 0.5-1 mg by mouth 4 (four) times daily as needed. For anxiety/insomnia    . anastrozole (ARIMIDEX) 1 MG tablet Take 1 tablet (1 mg total) by mouth daily. 90 tablet 3  . Biotin 1 MG CAPS Take by mouth daily.    . CYANOCOBALAMIN PO Take 1,000 mg by mouth daily. Time release capsule    . ibandronate (BONIVA) 150 MG tablet Take 1 tablet (150 mg total) by mouth every 30 (thirty) days. In AM w/glass of H2O on empty stomach Nothing oral & dont lie down for 60 min 3 tablet 3  . metoprolol succinate (TOPROL-XL) 25 MG 24 hr tablet Take 12.5 mg by mouth at bedtime.      No current facility-administered medications for this visit.   Facility-Administered Medications Ordered in Other Visits  Medication Dose Route Frequency Provider Last Rate Last Dose  . sodium chloride 0.9 % injection 10 mL  10 mL Intracatheter PRN Consuela Mimes, MD   10 mL at 09/02/12 1619    PHYSICAL EXAMINATION: ECOG PERFORMANCE STATUS: 1 - Symptomatic but completely ambulatory  Filed Vitals:   05/25/15 1152  BP: 135/70  Pulse: 46  Temp: 99.1 F (37.3 C)  Resp: 17   Filed Weights  05/25/15 1152  Weight: 126 lb 9.6 oz (57.425 kg)    GENERAL:alert, no distress and comfortable SKIN: skin color, texture, turgor are normal, no rashes or significant lesions EYES: normal, Conjunctiva are pink and non-injected, sclera clear OROPHARYNX:no exudate, no erythema and lips, buccal mucosa, and tongue normal  NECK: supple, thyroid normal size, non-tender, without  nodularity LYMPH:  no palpable lymphadenopathy in the cervical, axillary or inguinal LUNGS: clear to auscultation and percussion with normal breathing effort HEART: regular rate & rhythm and no murmurs and no lower extremity edema ABDOMEN:abdomen soft, non-tender and normal bowel sounds Musculoskeletal:no cyanosis of digits and no clubbing  NEURO: alert & oriented x 3 with fluent speech, no focal motor/sensory deficits  LABORATORY DATA:  I have reviewed the data as listed   Chemistry      Component Value Date/Time   NA 139 09/13/2014 1404   NA 140 02/24/2013 1227   K 3.5 09/13/2014 1404   K 4.1 02/24/2013 1227   CL 105 02/24/2013 1227   CL 108* 02/12/2013 1330   CO2 22 09/13/2014 1404   CO2 26 02/24/2013 1227   BUN 13.0 09/13/2014 1404   BUN 12 02/24/2013 1227   CREATININE 0.8 09/13/2014 1404   CREATININE 0.63 02/24/2013 1227      Component Value Date/Time   CALCIUM 9.2 09/13/2014 1404   CALCIUM 9.8 02/24/2013 1227   ALKPHOS 83 09/13/2014 1404   ALKPHOS 86 11/11/2012 1225   AST 18 09/13/2014 1404   AST 24 11/11/2012 1225   ALT 30 09/13/2014 1404   ALT 21 11/11/2012 1225   BILITOT 0.23 09/13/2014 1404   BILITOT 0.3 11/11/2012 1225       Lab Results  Component Value Date   WBC 7.9 09/13/2014   HGB 12.6 09/13/2014   HCT 38.2 09/13/2014   MCV 91.9 09/13/2014   PLT 284 09/13/2014   NEUTROABS 6.4 09/13/2014    ASSESSMENT & PLAN:  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 Osteopenia with risk factors: Bone density showed osteopenia and patient is currently on Boniva with calcium and vitamin D.  Breast Cancer Surveillance: 1.  chest exam  05/25/2015: Normal 2. Mammogram  No role of mammograms since she had bilateral mastectomies  Return to clinic in 6 months for follow-up  And after that we will see her once a year.    No orders of the defined types were placed in this encounter.   The patient has a good understanding of the overall plan. she agrees with it. she will call with any problems that may develop before the next visit here.   Cassie Eisenmenger, MD

## 2015-05-26 ENCOUNTER — Ambulatory Visit: Payer: BLUE CROSS/BLUE SHIELD | Admitting: Hematology and Oncology

## 2015-08-14 ENCOUNTER — Other Ambulatory Visit: Payer: Self-pay | Admitting: Hematology and Oncology

## 2015-08-14 DIAGNOSIS — C50412 Malignant neoplasm of upper-outer quadrant of left female breast: Secondary | ICD-10-CM

## 2015-09-08 ENCOUNTER — Telehealth: Payer: Self-pay

## 2015-09-08 NOTE — Telephone Encounter (Signed)
LMOVM - returning pt call re: new spot under her arm she would like checked.  Advised pt to call clinic Monday am for appt with Dr. Lindi Adie - he has availability.

## 2015-09-11 ENCOUNTER — Telehealth: Payer: Self-pay | Admitting: Hematology and Oncology

## 2015-09-11 NOTE — Telephone Encounter (Signed)
Terri had called her back and advised her coulsee her today,she has a conflict and cannot come in this am but will come in 11/22

## 2015-09-11 NOTE — Assessment & Plan Note (Addendum)
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 Osteopenia with risk factors: Bone density showed osteopenia and patient is currently on Boniva with calcium and vitamin D.  Breast Cancer Surveillance: 1. chest exam 09/12/2015: Normal 2. Mammogram No role of mammograms since she had bilateral mastectomies  Return to clinic in 1 year.

## 2015-09-12 ENCOUNTER — Telehealth: Payer: Self-pay | Admitting: Hematology and Oncology

## 2015-09-12 ENCOUNTER — Encounter: Payer: Self-pay | Admitting: Hematology and Oncology

## 2015-09-12 ENCOUNTER — Ambulatory Visit (HOSPITAL_BASED_OUTPATIENT_CLINIC_OR_DEPARTMENT_OTHER): Payer: BLUE CROSS/BLUE SHIELD | Admitting: Hematology and Oncology

## 2015-09-12 VITALS — BP 129/73 | HR 62 | Temp 97.7°F | Resp 17 | Ht 63.0 in | Wt 126.4 lb

## 2015-09-12 DIAGNOSIS — C50412 Malignant neoplasm of upper-outer quadrant of left female breast: Secondary | ICD-10-CM | POA: Diagnosis not present

## 2015-09-12 NOTE — Telephone Encounter (Signed)
[facility] ultrasound 09/20/15 3:00 and [facility] will be 08/30/16 9:00 and orders have been faxed and avs has been pritned

## 2015-09-12 NOTE — Progress Notes (Signed)
Patient Care Team: [name], MD as PCP - General (Internal Medicine)  DIAGNOSIS: Primary cancer of upper outer quadrant of left female breast [facility])   Staging form: Breast, AJCC 7th Edition     Clinical: Stage IIA (T2, N1, cM0) - Unsigned       Staging comments: Staged at breast conference 8.28.13.      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 She also underwent radiation was given oral antiestrogen therapy since April 2014. She was switched to Arimidex in January 2016. She has been tolerating it extremely well with occasional hot flashes and myalgias. She stays very active and does routine exercises. She felt that palpable nodularity in the right axilla adjacent to the axillary scar.  REVIEW OF SYSTEMS:   Constitutional: Denies fevers, chills or abnormal weight loss Eyes: Denies blurriness of vision Ears, nose, mouth, throat, and face: Denies mucositis or sore throat Respiratory: Denies cough, dyspnea or wheezes Cardiovascular: Denies palpitation, chest discomfort or lower extremity swelling Gastrointestinal:  Denies nausea, heartburn or change in bowel habits Skin: Denies abnormal skin  rashes Lymphatics: Denies new lymphadenopathy or easy bruising Neurological:Denies numbness, tingling or new weaknesses Behavioral/Psych: Mood is stable, no new changes  Breast: Right axillary scar nodularity All other systems were reviewed with the patient and are negative.  I have reviewed the past medical history, past surgical history, social history and family history with the patient and they are unchanged from previous note.  ALLERGIES:  is allergic to adhesive.  MEDICATIONS:  Current Outpatient Prescriptions  Medication Sig Dispense Refill  . ALPRAZolam (XANAX) 0.5 MG tablet Take 0.5-1 mg by mouth 4 (four) times daily as needed. For anxiety/insomnia    . anastrozole (ARIMIDEX) 1 MG tablet TAKE 1 TABLET DAILY 90 tablet 3  . Biotin 1 MG CAPS Take by mouth daily.    . CYANOCOBALAMIN PO Take 1,000 mg by mouth daily. Time release capsule    . ibandronate (BONIVA) 150 MG tablet TAKE 1 TABLET EVERY 30 DAYS IN THE MORNING WITH GLASS OF WATER ON AN EMPTY STOMACH AND DON'T LIE DOWN FOR 60 MINUTES 3 tablet 3  . metoprolol succinate (TOPROL-XL) 25 MG 24 hr tablet Take 12.5 mg by mouth at bedtime.      No current facility-administered medications for this visit.   Facility-Administered Medications Ordered in Other Visits  Medication Dose Route Frequency Provider Last Rate Last Dose  . sodium chloride 0.9 % injection 10 mL  10 mL Intracatheter PRN Consuela Mimes, MD   10 mL at 09/02/12 1619    PHYSICAL EXAMINATION: ECOG PERFORMANCE STATUS: 0 - Asymptomatic  Filed Vitals:   09/12/15 0848  BP: 129/73  Pulse: 62  Temp: 97.7 F (36.5 C)  Resp: 17  Filed Weights   09/12/15 0848  Weight: 126 lb 6.4 oz (57.335 kg)    GENERAL:alert, no distress and comfortable SKIN: skin color, texture, turgor are normal, no rashes or significant lesions EYES: normal, Conjunctiva are pink and non-injected, sclera clear OROPHARYNX:no exudate, no erythema and lips, buccal mucosa, and tongue normal   NECK: supple, thyroid normal size, non-tender, without nodularity LYMPH:  no palpable lymphadenopathy in the cervical, axillary or inguinal LUNGS: clear to auscultation and percussion with normal breathing effort HEART: regular rate & rhythm and no murmurs and no lower extremity edema ABDOMEN:abdomen soft, non-tender and normal bowel sounds Musculoskeletal:no cyanosis of digits and no clubbing  NEURO: alert & oriented x 3 with fluent speech, no focal motor/sensory deficits BREno palpable lymphadenopathy but the right axillary scar is slightly nodular in nature. I believe it is the normal scar tissue. However because of her concern, I will obtain a right axillary ultrasound.(exam performed in the presence of a chaperone)  LABORATORY DATA:  I have reviewed the data as listed   Chemistry      Component Value Date/Time   NA 139 09/13/2014 1404   NA 140 02/24/2013 1227   K 3.5 09/13/2014 1404   K 4.1 02/24/2013 1227   CL 105 02/24/2013 1227   CL 108* 02/12/2013 1330   CO2 22 09/13/2014 1404   CO2 26 02/24/2013 1227   BUN 13.0 09/13/2014 1404   BUN 12 02/24/2013 1227   CREATININE 0.8 09/13/2014 1404   CREATININE 0.63 02/24/2013 1227      Component Value Date/Time   CALCIUM 9.2 09/13/2014 1404   CALCIUM 9.8 02/24/2013 1227   ALKPHOS 83 09/13/2014 1404   ALKPHOS 86 11/11/2012 1225   AST 18 09/13/2014 1404   AST 24 11/11/2012 1225   ALT 30 09/13/2014 1404   ALT 21 11/11/2012 1225   BILITOT 0.23 09/13/2014 1404   BILITOT 0.3 11/11/2012 1225       Lab Results  Component Value Date   WBC 7.9 09/13/2014   HGB 12.6 09/13/2014   HCT 38.2 09/13/2014   MCV 91.9 09/13/2014   PLT 284 09/13/2014   NEUTROABS 6.4 09/13/2014   ASSESSMENT & PLAN:  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 Osteopenia with risk factors: Bone density showed osteopenia and patient is currently on Boniva with calcium and vitamin D.  Right axillary scar lumpiness: I did not palpate any major concerns in this area. However to be certain, we will obtain right axillary ultrasound and follow-up.  Breast Cancer Surveillance: 1. chest exam 09/12/2015: Normal 2. Mammogram No role of mammograms since she had bilateral mastectomies Next bone density due Nov 2017  Return to clinic in 1 year.  No orders of the defined types were placed in this encounter.   The patient has a good understanding of the overall plan. she agrees with it. she will call with any problems that may develop before the next visit here.   Rulon Eisenmenger, MD 09/12/2015

## 2015-11-24 ENCOUNTER — Ambulatory Visit: Payer: BLUE CROSS/BLUE SHIELD | Admitting: Hematology and Oncology

## 2016-07-16 ENCOUNTER — Other Ambulatory Visit: Payer: Self-pay | Admitting: Hematology and Oncology

## 2016-07-16 DIAGNOSIS — C50412 Malignant neoplasm of upper-outer quadrant of left female breast: Secondary | ICD-10-CM

## 2016-08-23 ENCOUNTER — Other Ambulatory Visit: Payer: Self-pay | Admitting: Obstetrics and Gynecology

## 2016-08-23 DIAGNOSIS — R5381 Other malaise: Secondary | ICD-10-CM

## 2016-09-06 ENCOUNTER — Other Ambulatory Visit: Payer: Self-pay

## 2016-09-13 ENCOUNTER — Ambulatory Visit: Payer: BLUE CROSS/BLUE SHIELD | Admitting: Hematology and Oncology

## 2016-09-16 NOTE — Assessment & Plan Note (Signed)
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 Osteopenia with risk factors: Bone density showed osteopenia and patient is currently on Boniva with calcium and vitamin D.  Right axillary scar lumpiness: I did not palpate any major concerns in this area. However to be certain, we will obtain right axillary ultrasound and follow-up.  Breast Cancer Surveillance: 1. chest exam 09/17/16: Normal 2. Mammogram No role of mammograms since she had bilateral mastectomies Next bone density due Nov 2017  Return to clinic in 1 year.

## 2016-09-17 ENCOUNTER — Encounter: Payer: Self-pay | Admitting: Hematology and Oncology

## 2016-09-17 ENCOUNTER — Ambulatory Visit (HOSPITAL_BASED_OUTPATIENT_CLINIC_OR_DEPARTMENT_OTHER): Payer: BLUE CROSS/BLUE SHIELD | Admitting: Hematology and Oncology

## 2016-09-17 DIAGNOSIS — M899 Disorder of bone, unspecified: Secondary | ICD-10-CM | POA: Diagnosis not present

## 2016-09-17 DIAGNOSIS — C50412 Malignant neoplasm of upper-outer quadrant of left female breast: Secondary | ICD-10-CM

## 2016-09-17 NOTE — Progress Notes (Signed)
Patient Care Team: Cassie Poot, MD as PCP - General (Internal Medicine)  DIAGNOSIS:  Encounter Diagnosis  Name Primary?  . Primary cancer of upper outer quadrant of left female breast ([place])     SUMMARY OF ONCOLOGIC HISTORY:   Primary cancer of upper outer quadrant of left female breast ([place])   07/01/2012 - 10/15/2012 Neo-Adjuvant Chemotherapy     Taxotere Cytoxan 6      11/17/2012 Surgery     bilateral mastectomies : Right breast 1.5 cm invasive lobular cancer, grade 2, 4 sentinel nodes negative, ER positive PR positive HER-2 negative;  left breast mastectomy no cancer [facility]      12/29/2012 - 01/29/2013 Radiation Therapy     adjuvant radiation therapy      02/12/2013 -  Anti-estrogen oral therapy     tamoxifen 20 mg daily started 02/12/2013, changed to Arimidex 1 mg daily January 2016       CHIEF COMPLIANT: Follow-up on Arimidex therapy  INTERVAL HISTORY: ADRIE Carney is a 65 year old with above-mentioned history of left breast cancer treated with neoadjuvant chemotherapy with Taxotere and Cytoxan followed by bilateral mastectomies and adjuvant radiation and is currently on adjuvant antiestrogen therapy initially with tamoxifen but was later changed to Arimidex since January 2016. She has had mild aches and pains in the fingers but otherwise no toxicities to Arimidex. She denies any hot flashes or myalgias.  REVIEW OF SYSTEMS:   Constitutional: Denies fevers, chills or abnormal weight loss Eyes: Denies blurriness of vision Ears, nose, mouth, throat, and face: Denies mucositis or sore throat Respiratory: Denies cough, dyspnea or wheezes Cardiovascular: Denies palpitation, chest discomfort Gastrointestinal:  Denies nausea, heartburn or change in bowel habits Skin: Denies abnormal skin rashes Lymphatics: Denies new lymphadenopathy or easy bruising Neurological:Denies numbness, tingling or new weaknesses Behavioral/Psych: Mood is stable, no new changes  Extremities: No  lower extremity edema Breast:  denies any pain or lumps or nodules in either breasts All other systems were reviewed with the patient and are negative.  I have reviewed the past medical history, past surgical history, social history and family history with the patient and they are unchanged from previous note.  ALLERGIES:  is allergic to adhesive [tape].  MEDICATIONS:  Current Outpatient Prescriptions  Medication Sig Dispense Refill  . ALPRAZolam (XANAX) 0.5 MG tablet Take 0.5-1 mg by mouth 4 (four) times daily as needed. For anxiety/insomnia    . anastrozole (ARIMIDEX) 1 MG tablet TAKE 1 TABLET DAILY 90 tablet 3  . Biotin 1 MG CAPS Take by mouth daily.    . CYANOCOBALAMIN PO Take 1,000 mg by mouth daily. Time release capsule    . ibandronate (BONIVA) 150 MG tablet TAKE 1 TABLET EVERY 30 DAYS IN THE MORNING WITH GLASS OF WATER ON AN EMPTY STOMACH AND DON'T LIE DOWN FOR 60 MINUTES 3 tablet 3  . metoprolol succinate (TOPROL-XL) 25 MG 24 hr tablet Take 12.5 mg by mouth at bedtime.      No current facility-administered medications for this visit.    Facility-Administered Medications Ordered in Other Visits  Medication Dose Route Frequency Provider Last Rate Last Dose  . sodium chloride 0.9 % injection 10 mL  10 mL Intracatheter PRN Marcy Panning, MD   10 mL at 09/02/12 1619    PHYSICAL EXAMINATION: ECOG PERFORMANCE STATUS: 1 - Symptomatic but completely ambulatory  Vitals:   09/17/16 1056  BP: 133/69  Pulse: (!) 51  Resp: 18  Temp: 97.8 F (36.6 C)   Filed Weights  09/17/16 1056  Weight: 127 lb 3.2 oz (57.7 kg)    GENERAL:alert, no distress and comfortable SKIN: skin color, texture, turgor are normal, no rashes or significant lesions EYES: normal, Conjunctiva are pink and non-injected, sclera clear OROPHARYNX:no exudate, no erythema and lips, buccal mucosa, and tongue normal  NECK: supple, thyroid normal size, non-tender, without nodularity LYMPH:  no palpable  lymphadenopathy in the cervical, axillary or inguinal LUNGS: clear to auscultation and percussion with normal breathing effort HEART: regular rate & rhythm and no murmurs and no lower extremity edema ABDOMEN:abdomen soft, non-tender and normal bowel sounds MUSCULOSKELETAL:no cyanosis of digits and no clubbing  NEURO: alert & oriented x 3 with fluent speech, no focal motor/sensory deficits EXTREMITIES: No lower extremity edema BREAST: No palpable masses or nodules in either right or left breasts. No palpable axillary supraclavicular or infraclavicular adenopathy no breast tenderness or nipple discharge. (exam performed in the presence of a chaperone)  LABORATORY DATA:  I have reviewed the data as listed   Chemistry      Component Value Date/Time   NA 139 09/13/2014 1404   K 3.5 09/13/2014 1404   CL 105 02/24/2013 1227   CL 108 (H) 02/12/2013 1330   CO2 22 09/13/2014 1404   BUN 13.0 09/13/2014 1404   CREATININE 0.8 09/13/2014 1404      Component Value Date/Time   CALCIUM 9.2 09/13/2014 1404   ALKPHOS 83 09/13/2014 1404   AST 18 09/13/2014 1404   ALT 30 09/13/2014 1404   BILITOT 0.23 09/13/2014 1404       Lab Results  Component Value Date   WBC 7.9 09/13/2014   HGB 12.6 09/13/2014   HCT 38.2 09/13/2014   MCV 91.9 09/13/2014   PLT 284 09/13/2014   NEUTROABS 6.4 09/13/2014    ASSESSMENT & PLAN:  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 Osteopenia with risk factors: Bone density showed osteopenia and patient is currently on Boniva with calcium and vitamin D.  Bone density done in November 2017: T score -2.4 I encouraged her to continue with Boniva and calcium and vitamin D.  Breast Cancer Surveillance: 1.Chest exam 09/17/16: No palpable lumps or nodules 2. Mammogram No role of mammograms since she had bilateral mastectomies  Return to clinic in 1 year.   No orders of the defined types were placed in this encounter.  The patient has a good understanding of the overall plan. she agrees with it. she will call with any problems that may develop before the next visit here.   Rulon Eisenmenger, MD 09/17/16

## 2017-05-15 ENCOUNTER — Other Ambulatory Visit: Payer: Self-pay

## 2017-05-15 DIAGNOSIS — C50412 Malignant neoplasm of upper-outer quadrant of left female breast: Secondary | ICD-10-CM

## 2017-05-15 MED ORDER — ANASTROZOLE 1 MG PO TABS: 1.0000 mg | ORAL_TABLET | Freq: Every day | ORAL | 3 refills | Status: DC

## 2017-05-15 MED ORDER — IBANDRONATE SODIUM 150 MG PO TABS: ORAL_TABLET | ORAL | 3 refills | Status: DC

## 2017-09-17 ENCOUNTER — Telehealth: Payer: Self-pay | Admitting: Hematology and Oncology

## 2017-09-17 ENCOUNTER — Ambulatory Visit (HOSPITAL_BASED_OUTPATIENT_CLINIC_OR_DEPARTMENT_OTHER): Payer: Medicare Other | Admitting: Hematology and Oncology

## 2017-09-17 VITALS — BP 122/64 | HR 89 | Temp 97.6°F | Resp 20 | Ht 63.0 in | Wt 128.1 lb

## 2017-09-17 DIAGNOSIS — Z79811 Long term (current) use of aromatase inhibitors: Secondary | ICD-10-CM | POA: Diagnosis not present

## 2017-09-17 DIAGNOSIS — Z780 Asymptomatic menopausal state: Secondary | ICD-10-CM

## 2017-09-17 DIAGNOSIS — C50412 Malignant neoplasm of upper-outer quadrant of left female breast: Secondary | ICD-10-CM

## 2017-09-17 DIAGNOSIS — M8580 Other specified disorders of bone density and structure, unspecified site: Secondary | ICD-10-CM

## 2017-09-17 NOTE — Progress Notes (Signed)
Patient Care Team: [name], MD as PCP - General (Internal Medicine)  DIAGNOSIS:  Encounter Diagnosis  Name Primary?  . Primary cancer of upper outer quadrant of left female breast ([place])     SUMMARY OF ONCOLOGIC HISTORY:   Primary cancer of upper outer quadrant of left female breast ([place])   07/01/2012 - 10/15/2012 Neo-Adjuvant Chemotherapy     Taxotere Cytoxan 6      11/17/2012 Surgery     bilateral mastectomies : Right breast 1.5 cm invasive lobular cancer, grade 2, 4 sentinel nodes negative, ER positive PR positive HER-2 negative;  left breast mastectomy no cancer [facility]      12/29/2012 - 01/29/2013 Radiation Therapy     adjuvant radiation therapy      02/12/2013 -  Anti-estrogen oral therapy     tamoxifen 20 mg daily started 02/12/2013, changed to Arimidex 1 mg daily January 2016       CHIEF COMPLIANT: Follow-up on anastrozole therapy  INTERVAL HISTORY: Cassie Carney is a 66 year old with above-mentioned history of bilateral mastectomies for left breast cancer who is currently on antiestrogen therapy with Arimidex and tolerating it well.  She denies any hot flashes.  She does have some bilateral hip discomfort intermittently but not bad enough to cause her any problems.  REVIEW OF SYSTEMS:   Constitutional: Denies fevers, chills or abnormal weight loss Eyes: Denies blurriness of vision Ears, nose, mouth, throat, and face: Denies mucositis or sore throat Respiratory: Denies cough, dyspnea or wheezes Cardiovascular: Denies palpitation, chest discomfort Gastrointestinal:  Denies nausea, heartburn or change in bowel habits Skin: Denies abnormal skin rashes Lymphatics: Denies new lymphadenopathy or easy bruising Neurological:Denies numbness, tingling or new weaknesses Behavioral/Psych: Mood is stable, no new changes  Extremities: No lower extremity edema Breast:  denies any pain or lumps or nodules in either chest wall or axilla All other systems were reviewed with  the patient and are negative.  I have reviewed the past medical history, past surgical history, social history and family history with the patient and they are unchanged from previous note.  ALLERGIES:  is allergic to adhesive [tape].  MEDICATIONS:  Current Outpatient Medications  Medication Sig Dispense Refill  . ALPRAZolam (XANAX) 0.5 MG tablet Take 0.5-1 mg by mouth 4 (four) times daily as needed. For anxiety/insomnia    . anastrozole (ARIMIDEX) 1 MG tablet Take 1 tablet (1 mg total) by mouth daily. 90 tablet 3  . Biotin 1 MG CAPS Take by mouth daily.    . CYANOCOBALAMIN PO Take 1,000 mg by mouth daily. Time release capsule    . ibandronate (BONIVA) 150 MG tablet TAKE 1 TABLET EVERY 30 DAYS IN THE MORNING WITH GLASS OF WATER ON AN EMPTY STOMACH AND DON'T LIE DOWN FOR 60 MINUTES 3 tablet 3  . metoprolol succinate (TOPROL-XL) 25 MG 24 hr tablet Take 12.5 mg by mouth at bedtime.      No current facility-administered medications for this visit.    Facility-Administered Medications Ordered in Other Visits  Medication Dose Route Frequency Provider Last Rate Last Dose  . sodium chloride 0.9 % injection 10 mL  10 mL Intracatheter PRN Marcy Panning, MD   10 mL at 09/02/12 1619    PHYSICAL EXAMINATION: ECOG PERFORMANCE STATUS: 0 - Asymptomatic  Vitals:   09/17/17 1125  BP: 122/64  Pulse: 89  Resp: 20  Temp: 97.6 F (36.4 C)  SpO2: 100%   Filed Weights   09/17/17 1125  Weight: 128 lb 1.6 oz (58.1  kg)    GENERAL:alert, no distress and comfortable SKIN: skin color, texture, turgor are normal, no rashes or significant lesions EYES: normal, Conjunctiva are pink and non-injected, sclera clear OROPHARYNX:no exudate, no erythema and lips, buccal mucosa, and tongue normal  NECK: supple, thyroid normal size, non-tender, without nodularity LYMPH:  no palpable lymphadenopathy in the cervical, axillary or inguinal LUNGS: clear to auscultation and percussion with normal breathing  effort HEART: regular rate & rhythm and no murmurs and no lower extremity edema ABDOMEN:abdomen soft, non-tender and normal bowel sounds MUSCULOSKELETAL:no cyanosis of digits and no clubbing  NEURO: alert & oriented x 3 with fluent speech, no focal motor/sensory deficits EXTREMITIES: No lower extremity edema BREAST: No palpable lumps or nodules in chest wall or axilla. (exam performed in the presence of a chaperone)  LABORATORY DATA:  I have reviewed the data as listed   Chemistry      Component Value Date/Time   NA 139 09/13/2014 1404   K 3.5 09/13/2014 1404   CL 105 02/24/2013 1227   CL 108 (H) 02/12/2013 1330   CO2 22 09/13/2014 1404   BUN 13.0 09/13/2014 1404   CREATININE 0.8 09/13/2014 1404      Component Value Date/Time   CALCIUM 9.2 09/13/2014 1404   ALKPHOS 83 09/13/2014 1404   AST 18 09/13/2014 1404   ALT 30 09/13/2014 1404   BILITOT 0.23 09/13/2014 1404       Lab Results  Component Value Date   WBC 7.9 09/13/2014   HGB 12.6 09/13/2014   HCT 38.2 09/13/2014   MCV 91.9 09/13/2014   PLT 284 09/13/2014   NEUTROABS 6.4 09/13/2014    ASSESSMENT & PLAN:  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  Osteopenia with risk factors: Bone density showed osteopenia and patient is currently on Boniva with calcium and vitamin D.  Bone density done in November 2017: T score -2.4 I encouraged her to continue with Boniva and calcium and vitamin  D. I recommended that she get another bone density test next year  Breast Cancer Surveillance: 1.Chest exam  09/09/2017: No palpable lumps or nodules 2. Mammogram No role of mammograms since she had bilateral mastectomies  Return to clinic in 1 year.   I spent 25 minutes talking to the patient of which more than half was spent in counseling and coordination of care.  No orders of the defined types were placed in this encounter.  The patient has a good understanding of the overall plan. she agrees with it. she will call with any problems that may develop before the next visit here.   Rulon Eisenmenger, MD 09/17/17

## 2017-09-17 NOTE — Assessment & Plan Note (Signed)
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  Osteopenia with risk factors: Bone density showed osteopenia and patient is currently on Boniva with calcium and vitamin D.  Bone density done in November 2017: T score -2.4 I encouraged her to continue with Boniva and calcium and vitamin D.  Breast Cancer Surveillance: 1.Chest exam  09/09/2017: No palpable lumps or nodules 2. Mammogram No role of mammograms since she had bilateral mastectomies  Return to clinic in 1 year.

## 2017-09-17 NOTE — Telephone Encounter (Signed)
Gave patient AVS and calendar of upcoming November 2019 appointments.

## 2017-12-02 ENCOUNTER — Other Ambulatory Visit: Payer: Self-pay | Admitting: Hematology and Oncology

## 2017-12-02 DIAGNOSIS — E2839 Other primary ovarian failure: Secondary | ICD-10-CM

## 2018-05-27 ENCOUNTER — Telehealth: Payer: Self-pay | Admitting: Hematology and Oncology

## 2018-05-27 NOTE — Telephone Encounter (Signed)
Tried to reach regarding voicemail °

## 2018-05-28 ENCOUNTER — Telehealth: Payer: Self-pay | Admitting: Hematology and Oncology

## 2018-05-28 NOTE — Telephone Encounter (Signed)
tried to reach regarding voicemail. I did leave a message

## 2018-05-29 ENCOUNTER — Telehealth: Payer: Self-pay | Admitting: Hematology and Oncology

## 2018-05-29 NOTE — Telephone Encounter (Signed)
Returned patients call to r/s upcoming nov appts

## 2018-06-30 ENCOUNTER — Other Ambulatory Visit: Payer: Self-pay | Admitting: Hematology and Oncology

## 2018-06-30 DIAGNOSIS — C50412 Malignant neoplasm of upper-outer quadrant of left female breast: Secondary | ICD-10-CM

## 2018-08-14 ENCOUNTER — Other Ambulatory Visit: Payer: Self-pay | Admitting: Hematology and Oncology

## 2018-08-14 DIAGNOSIS — C50412 Malignant neoplasm of upper-outer quadrant of left female breast: Secondary | ICD-10-CM

## 2018-09-09 ENCOUNTER — Ambulatory Visit: Payer: Medicare Other | Admitting: Hematology and Oncology

## 2018-09-14 ENCOUNTER — Ambulatory Visit
Admission: RE | Admit: 2018-09-14 | Discharge: 2018-09-14 | Disposition: A | Discharge status: Home / Self Care | Payer: Medicare Other | Source: Ambulatory Visit | Attending: Hematology and Oncology | Admitting: Hematology and Oncology

## 2018-09-14 DIAGNOSIS — E2839 Other primary ovarian failure: Secondary | ICD-10-CM

## 2018-09-21 ENCOUNTER — Inpatient Hospital Stay: Payer: Medicare Other | Attending: Hematology and Oncology | Admitting: Hematology and Oncology

## 2018-09-21 ENCOUNTER — Telehealth: Payer: Self-pay | Admitting: Hematology and Oncology

## 2018-09-21 DIAGNOSIS — Z923 Personal history of irradiation: Secondary | ICD-10-CM | POA: Diagnosis not present

## 2018-09-21 DIAGNOSIS — Z79899 Other long term (current) drug therapy: Secondary | ICD-10-CM | POA: Insufficient documentation

## 2018-09-21 DIAGNOSIS — Z170 Estrogen receptor positive status [ER+]: Secondary | ICD-10-CM | POA: Insufficient documentation

## 2018-09-21 DIAGNOSIS — M8580 Other specified disorders of bone density and structure, unspecified site: Secondary | ICD-10-CM | POA: Diagnosis not present

## 2018-09-21 DIAGNOSIS — Z9221 Personal history of antineoplastic chemotherapy: Secondary | ICD-10-CM | POA: Diagnosis not present

## 2018-09-21 DIAGNOSIS — Z79811 Long term (current) use of aromatase inhibitors: Secondary | ICD-10-CM

## 2018-09-21 DIAGNOSIS — Z9013 Acquired absence of bilateral breasts and nipples: Secondary | ICD-10-CM | POA: Diagnosis not present

## 2018-09-21 DIAGNOSIS — C50412 Malignant neoplasm of upper-outer quadrant of left female breast: Secondary | ICD-10-CM | POA: Diagnosis present

## 2018-09-21 MED ORDER — MULTIVITAMINS PO CAPS: 1.0000 | ORAL_CAPSULE | Freq: Every day | ORAL | Status: AC

## 2018-09-21 MED ORDER — IBANDRONATE SODIUM 150 MG PO TABS: ORAL_TABLET | ORAL | 3 refills | Status: DC

## 2018-09-21 MED ORDER — ANASTROZOLE 1 MG PO TABS: 1.0000 mg | ORAL_TABLET | Freq: Every day | ORAL | 3 refills | Status: DC

## 2018-09-21 NOTE — Telephone Encounter (Signed)
Gave patient AVS.  Did not need calendar.

## 2018-09-21 NOTE — Progress Notes (Signed)
Patient Care Team: [name], MD as PCP - General (Internal Medicine)  DIAGNOSIS:  Encounter Diagnosis  Name Primary?  . Primary cancer of upper outer quadrant of left female breast ([place])     SUMMARY OF ONCOLOGIC HISTORY:   Primary cancer of upper outer quadrant of left female breast ([place])   07/01/2012 - 10/15/2012 Neo-Adjuvant Chemotherapy     Taxotere Cytoxan 6    11/17/2012 Surgery     bilateral mastectomies : Right breast 1.5 cm invasive lobular cancer, grade 2, 4 sentinel nodes negative, ER positive PR positive HER-2 negative;  left breast mastectomy no cancer [facility]    12/29/2012 - 01/29/2013 Radiation Therapy     adjuvant radiation therapy    02/12/2013 -  Anti-estrogen oral therapy     tamoxifen 20 mg daily started 02/12/2013, changed to Arimidex 1 mg daily January 2016     CHIEF COMPLIANT: Annual follow-up of history of breast cancer currently on anastrozole  INTERVAL HISTORY: Cassie Carney is a 67 year old with above-mentioned history of left breast cancer underwent bilateral mastectomies radiation and is currently on anastrozole therapy.  She is tolerating it extremely well.  She does not have any hot flashes or myalgias.  She denies any pain lumps or nodules in the chest wall or axilla.  REVIEW OF SYSTEMS:   Constitutional: Denies fevers, chills or abnormal weight loss Eyes: Denies blurriness of vision Ears, nose, mouth, throat, and face: Denies mucositis or sore throat Respiratory: Denies cough, dyspnea or wheezes Cardiovascular: Denies palpitation, chest discomfort Gastrointestinal:  Denies nausea, heartburn or change in bowel habits Skin: Denies abnormal skin rashes Lymphatics: Denies new lymphadenopathy or easy bruising Neurological:Denies numbness, tingling or new weaknesses Behavioral/Psych: Mood is stable, no new changes  Extremities: No lower extremity edema Breast:  denies any pain or lumps or nodules in either chest wall or axilla All other systems  were reviewed with the patient and are negative.  I have reviewed the past medical history, past surgical history, social history and family history with the patient and they are unchanged from previous note.  ALLERGIES:  is allergic to adhesive [tape].  MEDICATIONS:  Current Outpatient Medications  Medication Sig Dispense Refill  . anastrozole (ARIMIDEX) 1 MG tablet Take 1 tablet (1 mg total) by mouth daily. 90 tablet 3  . CYANOCOBALAMIN PO Take 1,000 mg by mouth daily. Time release capsule    . ibandronate (BONIVA) 150 MG tablet TAKE 1 TABLET BY MOUTH ONCE EVERY MONTH IN THE MORNING WITH A GLASS OF WATER, ON AN EMPTY STOMACH. REMAIN UPRIGHT FOR AT LEAST 60 MINUTES. 3 tablet 3  . metoprolol succinate (TOPROL-XL) 25 MG 24 hr tablet Take 12.5 mg by mouth at bedtime.     . Multiple Vitamin (MULTIVITAMIN) capsule Take 1 capsule by mouth daily.     No current facility-administered medications for this visit.    Facility-Administered Medications Ordered in Other Visits  Medication Dose Route Frequency Provider Last Rate Last Dose  . sodium chloride 0.9 % injection 10 mL  10 mL Intracatheter PRN Marcy Panning, MD   10 mL at 09/02/12 1619    PHYSICAL EXAMINATION: ECOG PERFORMANCE STATUS: 1 - Symptomatic but completely ambulatory  Vitals:   09/21/18 1019  BP: 132/66  Pulse: (!) 55  Resp: 17  Temp: 98.4 F (36.9 C)  SpO2: 100%   Filed Weights   09/21/18 1019  Weight: 124 lb 8 oz (56.5 kg)    GENERAL:alert, no distress and comfortable SKIN: skin color, texture,  turgor are normal, no rashes or significant lesions EYES: normal, Conjunctiva are pink and non-injected, sclera clear OROPHARYNX:no exudate, no erythema and lips, buccal mucosa, and tongue normal  NECK: supple, thyroid normal size, non-tender, without nodularity LYMPH:  no palpable lymphadenopathy in the cervical, axillary or inguinal LUNGS: clear to auscultation and percussion with normal breathing effort HEART: regular  rate & rhythm and no murmurs and no lower extremity edema ABDOMEN:abdomen soft, non-tender and normal bowel sounds MUSCULOSKELETAL:no cyanosis of digits and no clubbing  NEURO: alert & oriented x 3 with fluent speech, no focal motor/sensory deficits EXTREMITIES: No lower extremity edema BREAST: Bilateral mastectomies no palpable lumps or nodules (exam performed in the presence of a chaperone)  LABORATORY DATA:  I have reviewed the data as listed CMP Latest Ref Rng & Units 09/13/2014 05/31/2014 11/15/2013  Glucose 70 - 140 mg/dl 138 122 109  BUN 7.0 - 26.0 mg/dL 13.0 14.3 11.1  Creatinine 0.6 - 1.1 mg/dL 0.8 1.3(H) 0.8  Sodium 136 - 145 mEq/L 139 144 142  Potassium 3.5 - 5.1 mEq/L 3.5 4.0 4.1  Chloride 96 - 112 mEq/L - - -  CO2 22 - 29 mEq/L _0 Calcium 8.4 - 10.4 mg/dL 9.2 8.9 9.3  Total Protein 6.4 - 8.3 g/dL 6.6 6.3(L) 6.5  Total Bilirubin 0.20 - 1.20 mg/dL 0.23 <0.20 0.41  Alkaline Phos 40 - 150 U/L 83 53 66  AST 5 - 34 U/L _1 ALT 0 - 55 U/L _2 Lab Results  Component Value Date   WBC 7.9 09/13/2014   HGB 12.6 09/13/2014   HCT 38.2 09/13/2014   MCV 91.9 09/13/2014   PLT 284 09/13/2014   NEUTROABS 6.4 09/13/2014    ASSESSMENT & PLAN:  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 We discussed with her the duration of antiestrogen therapy should be 7 years based on the current studies and guidelines.  Osteopenia with risk  factors: Bone density showed osteopenia and patient is currently on Boniva with calcium and vitamin D. Bone density done in 09/14/2018: T score -2 (improved from -2.4) I encouraged her to continue with Boniva and calcium and vitamin D.   Breast Cancer Surveillance: 1.Chest exam12/11/2017: Nopalpable lumps or nodules 2. Mammogram No role of mammograms since she had bilateral mastectomies  Return to clinic in 1 year.  No orders of the defined types were placed in this encounter.  The patient has a good understanding of the overall plan. she agrees with it. she will call with any problems that may develop before the next visit here.   [name], MD 09/21/18

## 2018-09-21 NOTE — Assessment & Plan Note (Signed)
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  Osteopenia with risk factors: Bone density showed osteopenia and patient is currently on Boniva with calcium and vitamin D. Bone density done in 09/14/2018: T score -2 (improved from -2.4) I encouraged her to continue with Boniva and calcium and vitamin D.   Breast Cancer Surveillance: 1.Chest exam12/11/2017: Nopalpable lumps or nodules 2. Mammogram No role of mammograms since she had bilateral mastectomies  Return to clinic in 1 year.

## 2019-09-24 ENCOUNTER — Ambulatory Visit: Payer: Medicare Other | Admitting: Hematology and Oncology

## 2019-09-28 NOTE — Progress Notes (Signed)
Patient Care Team: [name], MD as PCP - General (Internal Medicine)  DIAGNOSIS:    ICD-10-CM   1. 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 with above-mentioned history of left breast cancer treated with bilateral mastectomies, radiation, and who is currently on anastrozole therapy. She presents to the clinic today for annual follow-up.   REVIEW OF SYSTEMS:   Constitutional: Denies fevers, chills or abnormal weight loss Eyes: Denies blurriness of vision Ears, nose, mouth, throat, and face: Denies mucositis or sore throat Respiratory: Denies cough, dyspnea or wheezes Cardiovascular: Denies palpitation, chest discomfort Gastrointestinal: Denies nausea, heartburn or change in bowel habits Skin: Denies abnormal skin rashes Lymphatics: Denies new lymphadenopathy or easy bruising Neurological: Denies numbness, tingling or new weaknesses Behavioral/Psych: Mood is stable, no new changes  Extremities: No lower extremity edema Breast: denies any pain or lumps or nodules in either breasts All other systems were reviewed with the patient and are negative.  I have reviewed the past medical history, past surgical history, social history and  family history with the patient and they are unchanged from previous note.  ALLERGIES:  is allergic to adhesive [tape].  MEDICATIONS:  Current Outpatient Medications  Medication Sig Dispense Refill  . anastrozole (ARIMIDEX) 1 MG tablet Take 1 tablet (1 mg total) by mouth daily. 90 tablet 3  . CYANOCOBALAMIN PO Take 1,000 mg by mouth daily. Time release capsule    . ibandronate (BONIVA) 150 MG tablet TAKE 1 TABLET BY MOUTH ONCE EVERY MONTH IN THE MORNING WITH A GLASS OF WATER, ON AN EMPTY STOMACH. REMAIN UPRIGHT FOR AT LEAST 60 MINUTES. 3 tablet 3  . metoprolol succinate (TOPROL-XL) 25 MG 24 hr tablet Take 12.5 mg by mouth at bedtime.     . Multiple Vitamin (MULTIVITAMIN) capsule Take 1 capsule by mouth daily.     No current facility-administered medications for this visit.    Facility-Administered Medications Ordered in Other Visits  Medication Dose Route Frequency Provider Last Rate Last Dose  . sodium chloride 0.9 % injection 10 mL  10 mL Intracatheter PRN Marcy Panning, MD   10 mL at 09/02/12 1619    PHYSICAL EXAMINATION: ECOG PERFORMANCE STATUS: 1 - Symptomatic but completely ambulatory  Vitals:   09/29/19 1031  BP: (!) 138/56  Pulse: (!) 43  Resp: 18  Temp: 98.2 F (36.8 C)  SpO2: 100%   Filed Weights   09/29/19 1031  Weight: 116 lb (52.6 kg)    GENERAL: alert, no distress and comfortable SKIN: skin color, texture, turgor are normal, no rashes or significant lesions EYES: normal, Conjunctiva are pink and non-injected, sclera clear OROPHARYNX: no exudate, no erythema and lips, buccal mucosa, and tongue normal  NECK: supple,  thyroid normal size, non-tender, without nodularity LYMPH: no palpable lymphadenopathy in the cervical, axillary or inguinal LUNGS: clear to auscultation and percussion with normal breathing effort HEART: regular rate & rhythm and no murmurs and no lower extremity edema ABDOMEN: abdomen soft, non-tender and normal bowel sounds MUSCULOSKELETAL: no  cyanosis of digits and no clubbing  NEURO: alert & oriented x 3 with fluent speech, no focal motor/sensory deficits EXTREMITIES: No lower extremity edema BREAST: No palpable masses or nodules in either right or left breasts. No palpable axillary supraclavicular or infraclavicular adenopathy no breast tenderness or nipple discharge. (exam performed in the presence of a chaperone)  LABORATORY DATA:  I have reviewed the data as listed CMP Latest Ref Rng & Units 09/13/2014 05/31/2014 11/15/2013  Glucose 70 - 140 mg/dl 138 122 109  BUN 7.0 - 26.0 mg/dL 13.0 14.3 11.1  Creatinine 0.6 - 1.1 mg/dL 0.8 1.3(H) 0.8  Sodium 136 - 145 mEq/L 139 144 142  Potassium 3.5 - 5.1 mEq/L 3.5 4.0 4.1  Chloride 96 - 112 mEq/L - - -  CO2 22 - 29 mEq/L _0 Calcium 8.4 - 10.4 mg/dL 9.2 8.9 9.3  Total Protein 6.4 - 8.3 g/dL 6.6 6.3(L) 6.5  Total Bilirubin 0.20 - 1.20 mg/dL 0.23 <0.20 0.41  Alkaline Phos 40 - 150 U/L 83 53 66  AST 5 - 34 U/L _1 ALT 0 - 55 U/L _2 Lab Results  Component Value Date   WBC 7.9 09/13/2014   HGB 12.6 09/13/2014   HCT 38.2 09/13/2014   MCV 91.9 09/13/2014   PLT 284 09/13/2014   NEUTROABS 6.4 09/13/2014    ASSESSMENT & PLAN:  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 She will finish a 7 years of anastrozole therapy by April 2021. She will stop it at that time  Osteopenia with risk factors: Bone density showed osteopenia and  patient is currently on Boniva with calcium and vitamin D. Bone density done in 09/14/2018: T score -2 (improved from -2.4) I renewed her prescription for Boniva along with calcium and vitamin D.  Breast Cancer Surveillance: 1.Chest exam12/06/2019: Nopalpable lumps or nodules 2. Mammogram No role of mammograms since she had bilateral mastectomies  Return to clinic in 1 year and after that we can decide if she wants to follow with her primary care physician..  No orders of the defined types were placed in this encounter.  The patient has a good understanding of the overall plan. she agrees with it. she will call with any problems that may develop before the next visit here.  Nicholas Lose, MD 09/29/2019  Julious Oka Dorshimer, am acting as scribe for Dr. Nicholas Lose.  I have reviewed the above documentation for accuracy and completeness, and I agree with the above.

## 2019-09-29 ENCOUNTER — Other Ambulatory Visit: Payer: Self-pay

## 2019-09-29 ENCOUNTER — Inpatient Hospital Stay: Payer: Medicare Other | Attending: Hematology and Oncology | Admitting: Hematology and Oncology

## 2019-09-29 DIAGNOSIS — Z9221 Personal history of antineoplastic chemotherapy: Secondary | ICD-10-CM | POA: Insufficient documentation

## 2019-09-29 DIAGNOSIS — Z79811 Long term (current) use of aromatase inhibitors: Secondary | ICD-10-CM | POA: Diagnosis not present

## 2019-09-29 DIAGNOSIS — Z79899 Other long term (current) drug therapy: Secondary | ICD-10-CM | POA: Insufficient documentation

## 2019-09-29 DIAGNOSIS — Z9013 Acquired absence of bilateral breasts and nipples: Secondary | ICD-10-CM | POA: Insufficient documentation

## 2019-09-29 DIAGNOSIS — M8580 Other specified disorders of bone density and structure, unspecified site: Secondary | ICD-10-CM | POA: Diagnosis not present

## 2019-09-29 DIAGNOSIS — Z170 Estrogen receptor positive status [ER+]: Secondary | ICD-10-CM | POA: Diagnosis not present

## 2019-09-29 DIAGNOSIS — Z923 Personal history of irradiation: Secondary | ICD-10-CM | POA: Diagnosis not present

## 2019-09-29 DIAGNOSIS — C50412 Malignant neoplasm of upper-outer quadrant of left female breast: Secondary | ICD-10-CM | POA: Diagnosis present

## 2019-09-29 MED ORDER — ANASTROZOLE 1 MG PO TABS: 1.0000 mg | ORAL_TABLET | Freq: Every day | ORAL | 0 refills | Status: DC

## 2019-09-29 MED ORDER — IBANDRONATE SODIUM 150 MG PO TABS: ORAL_TABLET | ORAL | 3 refills | Status: DC

## 2019-09-29 MED ORDER — BIOTIN 10 MG PO CAPS: 10.0000 mg | ORAL_CAPSULE | Freq: Every day | ORAL | Status: AC

## 2019-09-29 NOTE — Assessment & Plan Note (Signed)
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 We discussed with her the duration of antiestrogen therapy should be 7 years based on the current studies and guidelines.  Osteopenia with risk factors: Bone density showed osteopenia and patient is currently on Boniva with calcium and vitamin D. Bone density done in 09/14/2018: T score -2 (improved from -2.4) I encouraged her to continue with Boniva and calcium and vitamin D.  Breast Cancer Surveillance: 1.Chest exam12/06/2019: Nopalpable lumps or nodules 2. Mammogram No role of mammograms since she had bilateral mastectomies  Return to clinic in 1 year.

## 2019-09-30 ENCOUNTER — Telehealth: Payer: Self-pay | Admitting: Hematology and Oncology

## 2019-09-30 NOTE — Telephone Encounter (Signed)
I talk with patient regarding schedule  

## 2020-09-20 NOTE — Assessment & Plan Note (Signed)
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 She will finish a 7 years of anastrozole therapy by April 2021. She will stop it at that time  Osteopenia with risk factors: Bone density showed osteopenia and patient is currently on Boniva with calcium and vitamin D. Bone density done in11/25/2019: T score -2(improved from -2.4) I renewed her prescription for Boniva along with calcium and vitamin D.  Breast Cancer Surveillance: 1.Chest exam12/11/2019: Nopalpable lumps or nodules 2. Mammogram No role of mammograms since she had bilateral mastectomies  Follow up with her primary care physician.Cassie Carney

## 2020-09-20 NOTE — Progress Notes (Signed)
Patient Care Team: [name], MD as PCP - General (Internal Medicine)  DIAGNOSIS:    ICD-10-CM   1. 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 with above-mentioned history of left breast cancer treated with bilateral mastectomies, radiation, and who is currently on anastrozole therapy. She presents to the clinic today for annual follow-up.    ALLERGIES:  is allergic to adhesive [tape].  MEDICATIONS:  Current Outpatient Medications  Medication Sig Dispense Refill  . anastrozole (ARIMIDEX) 1 MG tablet Take 1 tablet (1 mg total) by mouth daily. 90 tablet 0  . Biotin 10 MG CAPS Take 10 mg by mouth daily. 30 capsule   . CYANOCOBALAMIN PO Take 1,000 mg by mouth daily. Time release capsule    . ibandronate (BONIVA) 150 MG tablet TAKE 1 TABLET BY MOUTH ONCE EVERY MONTH IN THE MORNING WITH A GLASS OF WATER, ON AN EMPTY STOMACH. REMAIN UPRIGHT FOR AT LEAST 60 MINUTES. 3 tablet 3  . metoprolol succinate (TOPROL-XL) 25 MG 24 hr tablet Take 12.5 mg by mouth at bedtime.     . Multiple Vitamin (MULTIVITAMIN) capsule Take 1 capsule by mouth daily.     No current facility-administered medications for this visit.   Facility-Administered Medications  Ordered in Other Visits  Medication Dose Route Frequency Provider Last Rate Last Admin  . sodium chloride 0.9 % injection 10 mL  10 mL Intracatheter PRN Marcy Panning, MD   10 mL at 09/02/12 1619    PHYSICAL EXAMINATION: ECOG PERFORMANCE STATUS: 1 - Symptomatic but completely ambulatory  Vitals:   09/21/20 1518  BP: (!) 114/55  Pulse: (!) 49  Resp: 18  Temp: (!) 97.2 F (36.2 C)  SpO2: 100%   Filed Weights   09/21/20 1518  Weight: 118 lb 4.8 oz (53.7 kg)    BREAST: No palpable masses or nodules in either right or left breasts. No palpable axillary supraclavicular or infraclavicular adenopathy no breast tenderness or nipple discharge. (exam performed in the presence of a chaperone)  LABORATORY DATA:  I have reviewed the data as listed CMP Latest Ref Rng & Units 09/13/2014 05/31/2014 11/15/2013  Glucose 70 - 140 mg/dl 138 122 109  BUN 7.0 - 26.0 mg/dL 13.0 14.3 11.1  Creatinine 0.6 - 1.1 mg/dL 0.8 1.3(H) 0.8  Sodium 136 - 145 mEq/L 139 144 142  Potassium 3.5 - 5.1 mEq/L 3.5 4.0 4.1  Chloride 96 - 112 mEq/L - - -  CO2 22 - 29 mEq/L _0 Calcium 8.4 - 10.4 mg/dL 9.2 8.9 9.3  Total Protein 6.4 - 8.3 g/dL 6.6 6.3(L) 6.5  Total Bilirubin 0.20 - 1.20 mg/dL 0.23 <0.20 0.41  Alkaline Phos 40 - 150 U/L 83 53  66  AST 5 - 34 U/L _0 ALT 0 - 55 U/L _1 Lab Results  Component Value Date   WBC 7.9 09/13/2014   HGB 12.6 09/13/2014   HCT 38.2 09/13/2014   MCV 91.9 09/13/2014   PLT 284 09/13/2014   NEUTROABS 6.4 09/13/2014    ASSESSMENT & PLAN:  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 Bone density done in11/25/2019: T score -2(improved from -2.4)  Breast Cancer Surveillance: 1.Chest exam12/11/2019: Nopalpable lumps or nodules 2. Mammogram No role of mammograms since she had bilateral mastectomies  Follow up with her primary care physician.  Follow-up with [place] on an as-needed basis.   No orders of the defined types were placed in this encounter.  The patient has a good understanding of the overall plan. she agrees with it. she will call with any problems that may develop before the next visit here.  Total time spent: 20 mins including face to face time and time spent for planning, charting and coordination of care  Nicholas Lose, MD 09/21/2020  I, Cloyde Reams Dorshimer, am acting as scribe for Dr. Nicholas Lose.  I have reviewed the above documentation for accuracy and completeness, and I agree with the above.

## 2020-09-21 ENCOUNTER — Inpatient Hospital Stay: Payer: Medicare Other | Attending: Hematology and Oncology | Admitting: Hematology and Oncology

## 2020-09-21 ENCOUNTER — Other Ambulatory Visit: Payer: Self-pay

## 2020-09-21 DIAGNOSIS — Z79811 Long term (current) use of aromatase inhibitors: Secondary | ICD-10-CM | POA: Diagnosis not present

## 2020-09-21 DIAGNOSIS — Z9013 Acquired absence of bilateral breasts and nipples: Secondary | ICD-10-CM | POA: Diagnosis not present

## 2020-09-21 DIAGNOSIS — Z79899 Other long term (current) drug therapy: Secondary | ICD-10-CM | POA: Insufficient documentation

## 2020-09-21 DIAGNOSIS — M8580 Other specified disorders of bone density and structure, unspecified site: Secondary | ICD-10-CM | POA: Insufficient documentation

## 2020-09-21 DIAGNOSIS — Z170 Estrogen receptor positive status [ER+]: Secondary | ICD-10-CM | POA: Diagnosis not present

## 2020-09-21 DIAGNOSIS — C50412 Malignant neoplasm of upper-outer quadrant of left female breast: Secondary | ICD-10-CM | POA: Insufficient documentation

## 2020-09-21 DIAGNOSIS — Z923 Personal history of irradiation: Secondary | ICD-10-CM | POA: Diagnosis not present

## 2020-09-26 ENCOUNTER — Telehealth: Payer: Self-pay | Admitting: Hematology and Oncology

## 2020-09-26 NOTE — Telephone Encounter (Signed)
Per 12/2 los, no changes made to pt schedule

## 2020-09-28 ENCOUNTER — Ambulatory Visit: Payer: Medicare Other | Admitting: Hematology and Oncology

## 2020-12-26 ENCOUNTER — Other Ambulatory Visit: Payer: Self-pay | Admitting: Hematology and Oncology

## 2020-12-26 DIAGNOSIS — C50412 Malignant neoplasm of upper-outer quadrant of left female breast: Secondary | ICD-10-CM

## 2021-04-24 ENCOUNTER — Other Ambulatory Visit: Payer: Self-pay | Admitting: *Deleted

## 2022-07-23 ENCOUNTER — Inpatient Hospital Stay: Payer: Medicare Other | Attending: Hematology and Oncology | Admitting: Hematology and Oncology

## 2022-07-23 ENCOUNTER — Other Ambulatory Visit: Payer: Self-pay

## 2022-07-23 DIAGNOSIS — Z9013 Acquired absence of bilateral breasts and nipples: Secondary | ICD-10-CM | POA: Insufficient documentation

## 2022-07-23 DIAGNOSIS — C50412 Malignant neoplasm of upper-outer quadrant of left female breast: Secondary | ICD-10-CM | POA: Insufficient documentation

## 2022-07-23 DIAGNOSIS — Z79811 Long term (current) use of aromatase inhibitors: Secondary | ICD-10-CM | POA: Insufficient documentation

## 2022-07-23 DIAGNOSIS — Z170 Estrogen receptor positive status [ER+]: Secondary | ICD-10-CM | POA: Diagnosis not present

## 2022-07-23 NOTE — Assessment & Plan Note (Signed)
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 Bone density done in11/25/2019: T score -2(improved from -2.4)  Breast Cancer Surveillance: 1.Chest examdone 07/23/2022: Nopalpable lumps or nodules 2. Mammogram No role of mammograms since she had bilateral mastectomies 

## 2022-07-23 NOTE — Progress Notes (Signed)
Patient Care Team: [name], MD as PCP - General (Internal Medicine)  DIAGNOSIS:  Encounter Diagnosis  Name Primary?   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 with above-mentioned history of left breast cancer treated with bilateral mastectomies, radiation, and who is currently on anastrozole therapy. She presents to the clinic today for annual follow-up. She reports today that she notice some swelling under the incision. She noticed it on this past Friday.     ALLERGIES:  is allergic to adhesive [tape].  MEDICATIONS:  Current Outpatient Medications  Medication Sig Dispense Refill   Biotin 10 MG CAPS Take 10 mg by mouth daily. 30 capsule    CYANOCOBALAMIN PO Take 1,000 mg by mouth daily. Time release capsule     ibandronate (BONIVA) 150 MG tablet TAKE 1 TABLET BY MOUTH ONCE EVERY MONTH IN THE MORNING WITH A GLASS OF WATER ON AN EMPTY STOMACH. REMAIN UPRIGHT FOR AT LEAST 60 MINUTES AFTER TAKING 3 tablet 0   metoprolol succinate (TOPROL-XL) 25 MG 24 hr tablet Take 12.5 mg by mouth at bedtime.      Multiple Vitamin (MULTIVITAMIN) capsule Take 1 capsule by mouth daily.     No current facility-administered medications for this visit.    Facility-Administered Medications Ordered in Other Visits  Medication Dose Route Frequency Provider Last Rate Last Admin   sodium chloride 0.9 % injection 10 mL  10 mL Intracatheter PRN Marcy Panning, MD   10 mL at 09/02/12 1619    PHYSICAL EXAMINATION: ECOG PERFORMANCE STATUS: 1 - Symptomatic but completely ambulatory  Vitals:   07/23/22 0912  BP: (!) 157/67  Pulse: (!) 58  Resp: 18  Temp: (!) 97.3 F (36.3 C)  SpO2: 100%   Filed Weights   07/23/22 0912  Weight: 123 lb 9.6 oz (56.1 kg)    BREAST: Bilateral mastectomies.  The scar tissue on the right chest wall appears to be normal.  I do not feel any lumps or nodules of concern.  The area where the patient had slight discomfort/swelling appears to be normal.  Therefore I do not believe she needs any imaging studies.  (exam performed in the presence of a chaperone)  LABORATORY DATA:  I have reviewed the data as listed    Latest Ref Rng & Units 09/13/2014    2:04 PM 05/31/2014    3:34 PM 11/15/2013   11:43 AM  CMP  Glucose 70 - 140 mg/dl 138  122  109   BUN 7.0 - 26.0 mg/dL 13.0  14.3  11.1   Creatinine 0.6 - 1.1 mg/dL 0.8  1.3  0.8   Sodium 136 - 145 mEq/L 139  144  142   Potassium 3.5 - 5.1 mEq/L 3.5  4.0  4.1   CO2 22 - 29 mEq/L _0 Calcium 8.4 - 10.4 mg/dL 9.2  8.9  9.3   Total Protein 6.4 - 8.3 g/dL 6.6  6.3  6.5   Total Bilirubin 0.20 - 1.20 mg/dL 0.23  <0.20  0.41   Alkaline Phos 40 - 150 U/L 83  53  66   AST 5 - 34 U/L _1 ALT 0 - 55 U/L _2 Lab Results  Component Value Date   WBC 7.9 09/13/2014   HGB 12.6 09/13/2014   HCT 38.2 09/13/2014   MCV 91.9 09/13/2014   PLT 284 09/13/2014   NEUTROABS 6.4 09/13/2014    ASSESSMENT & PLAN:  Primary cancer of upper outer quadrant of left female breast ([place]) 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  Bone density done in 09/14/2018: T score -2 (improved from -2.4)   Breast Cancer Surveillance: 1. Chest exam done 07/23/2022: The area of concern for the patient in the right mastectomy scar is at the lateral aspect of the scar and I do not feel any lumps or nodules of concern in that region.  The left chest wall scar is without any abnormalities. 2. Mammogram  No role of mammograms since she had bilateral mastectomies Return to clinic on an as-needed basis.  No orders of the defined types were placed in this encounter.  The patient has a good understanding of the overall plan. she agrees with it. she will call with any problems that may develop before the next visit here. Total time spent: 30 mins including face to face time and time spent for planning, charting and co-ordination of care   [name], MD 07/23/22    [name] am scribing for Dr. Lindi Adie  I have reviewed the above documentation for accuracy and completeness, and I agree with the above.

## 2024-11-15 ENCOUNTER — Encounter: Admitting: Obstetrics and Gynecology

## 2024-11-16 ENCOUNTER — Telehealth: Payer: Self-pay

## 2024-11-16 NOTE — Telephone Encounter (Signed)
 Contacted patient to reschedule her appointment. Patient said she was on a call and would call back.

## 2024-12-01 ENCOUNTER — Encounter: Admitting: Obstetrics and Gynecology
